# Patient Record
Sex: Female | Born: 1950 | Race: White | Hispanic: No | Marital: Married | Smoking: Former smoker
Health system: Southern US, Community
[De-identification: ages and names within clinical notes are randomized; demographics above are authoritative.]

## PROBLEM LIST (undated history)

## (undated) DIAGNOSIS — I10 Essential (primary) hypertension: Secondary | ICD-10-CM

## (undated) DIAGNOSIS — E785 Hyperlipidemia, unspecified: Secondary | ICD-10-CM

## (undated) DIAGNOSIS — R011 Cardiac murmur, unspecified: Secondary | ICD-10-CM

## (undated) DIAGNOSIS — K219 Gastro-esophageal reflux disease without esophagitis: Secondary | ICD-10-CM

## (undated) DIAGNOSIS — M1990 Unspecified osteoarthritis, unspecified site: Secondary | ICD-10-CM

## (undated) DIAGNOSIS — H269 Unspecified cataract: Secondary | ICD-10-CM

## (undated) HISTORY — PX: COSMETIC SURGERY: SHX468

## (undated) HISTORY — DX: Hyperlipidemia, unspecified: E78.5

## (undated) HISTORY — DX: Unspecified osteoarthritis, unspecified site: M19.90

## (undated) HISTORY — DX: Essential (primary) hypertension: I10

## (undated) HISTORY — DX: Gastro-esophageal reflux disease without esophagitis: K21.9

## (undated) HISTORY — DX: Cardiac murmur, unspecified: R01.1

## (undated) HISTORY — PX: TUBAL LIGATION: SHX77

## (undated) HISTORY — DX: Unspecified cataract: H26.9

## (undated) HISTORY — PX: ANTERIOR CRUCIATE LIGAMENT REPAIR: SHX115

## (undated) HISTORY — PX: HIP ARTHROPLASTY: SHX981

## (undated) HISTORY — PX: REPLACEMENT TOTAL KNEE: SUR1224

## (undated) HISTORY — PX: JOINT REPLACEMENT: SHX530

---

## 2020-12-19 ENCOUNTER — Other Ambulatory Visit: Payer: Self-pay

## 2020-12-19 ENCOUNTER — Ambulatory Visit (INDEPENDENT_AMBULATORY_CARE_PROVIDER_SITE_OTHER): Payer: Medicare Other | Admitting: Family Medicine

## 2020-12-19 ENCOUNTER — Encounter: Payer: Self-pay | Admitting: Family Medicine

## 2020-12-19 VITALS — BP 138/79 | HR 67 | Temp 97.2°F | Ht 64.0 in | Wt 143.8 lb

## 2020-12-19 DIAGNOSIS — G2581 Restless legs syndrome: Secondary | ICD-10-CM | POA: Diagnosis not present

## 2020-12-19 DIAGNOSIS — G609 Hereditary and idiopathic neuropathy, unspecified: Secondary | ICD-10-CM

## 2020-12-19 DIAGNOSIS — G8929 Other chronic pain: Secondary | ICD-10-CM | POA: Diagnosis not present

## 2020-12-19 DIAGNOSIS — Z961 Presence of intraocular lens: Secondary | ICD-10-CM | POA: Diagnosis not present

## 2020-12-19 DIAGNOSIS — Z0001 Encounter for general adult medical examination with abnormal findings: Secondary | ICD-10-CM | POA: Diagnosis not present

## 2020-12-19 DIAGNOSIS — M25552 Pain in left hip: Secondary | ICD-10-CM

## 2020-12-19 DIAGNOSIS — Z9849 Cataract extraction status, unspecified eye: Secondary | ICD-10-CM | POA: Diagnosis not present

## 2020-12-19 DIAGNOSIS — E559 Vitamin D deficiency, unspecified: Secondary | ICD-10-CM

## 2020-12-19 DIAGNOSIS — R253 Fasciculation: Secondary | ICD-10-CM

## 2020-12-19 DIAGNOSIS — Z0000 Encounter for general adult medical examination without abnormal findings: Secondary | ICD-10-CM

## 2020-12-19 NOTE — Progress Notes (Signed)
Subjective:  Patient ID: Christina Kane, female    DOB: Mar 23, 1951  Age: 70 y.o. MRN: [id number]  CC: Establish Care   HPI Christina Kane presents for new patient evaluation.  She moved here from [place] about 3 months ago and needs an ophthalmologist for follow-up on a recent cataract surgery first done in November.  The second cataract was done in January.  She has had multiple surgeries on the knees.  She is now concerned about pain in the left hip and would like to see an orthopedist.  She reports a neuropathy which is felt as numbness on the bottom of her feet.  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  She does not want to pursue this she is not interested in taking medication for the condition.  She has never had an abnormal Pap smear.  Her most recent one was in 2017 apparently.  She is not interested in gynecology referral but will see a lady provider in this office if she needs to have a female exam performed.  Depression screen [facility] 2/9 12/19/2020  Decreased Interest 0  Down, Depressed, Hopeless 0  PHQ - 2 Score 0  Altered sleeping 1  Tired, decreased energy 0  Change in appetite 1  Feeling bad or failure about yourself  0  Trouble concentrating 0  Moving slowly or fidgety/restless 0  Suicidal thoughts 0  PHQ-9 Score 2  Difficult doing work/chores Not difficult at all    History Christina Kane has a past medical history of Hyperlipidemia and Hypertension.   She has a past surgical history that includes Anterior cruciate ligament repair (Right) and Replacement total knee (Left).   Her family history includes Arthritis in her mother; Cancer in her brother; Hypertension in her mother and sister; Kidney disease in her daughter; Stroke in her mother.She reports that she quit smoking about 17 years ago. Her smoking use included cigarettes. She has never used smokeless tobacco. She reports current alcohol use of about 10.0  standard drinks of alcohol per week. She reports that she does not use drugs.    ROS Review of Systems  Constitutional: Negative.   HENT:  Negative for congestion.   Eyes:  Negative for visual disturbance (She is actually seeing okay but has had a recent cataract extraction bilaterally and needs follow-up on that.).  Respiratory:  Negative for shortness of breath.   Cardiovascular:  Negative for chest pain.  Gastrointestinal:  Negative for abdominal pain, constipation, diarrhea, nausea and vomiting.  Genitourinary:  Negative for difficulty urinating.  Musculoskeletal:  Positive for arthralgias (Knees and hips). Negative for myalgias.  Neurological:  Negative for headaches.  Psychiatric/Behavioral:  Negative for sleep disturbance.    Objective:  BP 138/79   Pulse 67   Temp (!) 97.2 F (36.2 C)   Ht 5\' 4"  (1.626 m)   Wt 143 lb 12.8 oz (65.2 kg)   SpO2 99%   BMI 24.68 kg/m   BP Readings from Last 3 Encounters:  12/19/20 138/79    Wt Readings from Last 3 Encounters:  12/19/20 143 lb 12.8 oz (65.2 kg)     Physical Exam Constitutional:      General: She is not in acute distress.    Appearance: She is well-developed.  HENT:     Head: Normocephalic and atraumatic.     Right Ear: External ear normal.     Left Ear: External ear normal.     Nose: Nose  normal.  Eyes:     Conjunctiva/sclera: Conjunctivae normal.     Pupils: Pupils are equal, round, and reactive to light.  Neck:     Thyroid: No thyromegaly.  Cardiovascular:     Rate and Rhythm: Normal rate and regular rhythm.     Heart sounds: Normal heart sounds. No murmur heard. Pulmonary:     Effort: Pulmonary effort is normal. No respiratory distress.     Breath sounds: Normal breath sounds. No wheezing or rales.  Abdominal:     General: Bowel sounds are normal. There is no distension.     Palpations: Abdomen is soft.     Tenderness: There is no abdominal tenderness.  Musculoskeletal:     Cervical back: Normal  range of motion and neck supple.  Lymphadenopathy:     Cervical: No cervical adenopathy.  Skin:    General: Skin is warm and dry.  Neurological:     Mental Status: She is alert and oriented to person, place, and time.     Deep Tendon Reflexes: Reflexes are normal and symmetric.  Psychiatric:        Behavior: Behavior normal.        Thought Content: Thought content normal.        Judgment: Judgment normal.      Assessment & Plan:   Christina Kane was seen today for establish care.  Diagnoses and all orders for this visit:  Well adult exam  Status post cataract extraction and insertion of intraocular lens, unspecified laterality  RLS (restless legs syndrome)  Benign fasciculation-cramp syndrome  Idiopathic peripheral neuropathy      I am having Christina Kane maintain her metoprolol tartrate, rosuvastatin, omeprazole, Biotin, Magnesium, Milk Thistle, and Multiple Vitamin (MULTIVITAMIN ADULT PO).  Allergies as of 12/19/2020   No Known Allergies      Medication List        Accurate as of December 19, 2020  8:11 PM. If you have any questions, ask your nurse or doctor.          Biotin 10 MG Caps Take by mouth.   Magnesium 400 MG Caps Take by mouth.   metoprolol tartrate 25 MG tablet Commonly known as: LOPRESSOR Take 25 mg by mouth daily.   Milk Thistle 250 MG Caps Take by mouth.   MULTIVITAMIN ADULT PO Take by mouth.   omeprazole 40 MG capsule Commonly known as: PRILOSEC Take 40 mg by mouth 2 (two) times a week.   rosuvastatin 10 MG tablet Commonly known as: CRESTOR Take 10 mg by mouth daily.         Follow-up: No follow-ups on file.  Claretta Fraise, M.D.

## 2020-12-21 ENCOUNTER — Other Ambulatory Visit: Payer: Self-pay

## 2020-12-21 ENCOUNTER — Other Ambulatory Visit: Payer: Medicare Other

## 2020-12-21 DIAGNOSIS — G2581 Restless legs syndrome: Secondary | ICD-10-CM | POA: Diagnosis not present

## 2020-12-21 DIAGNOSIS — E559 Vitamin D deficiency, unspecified: Secondary | ICD-10-CM | POA: Diagnosis not present

## 2020-12-21 DIAGNOSIS — Z9849 Cataract extraction status, unspecified eye: Secondary | ICD-10-CM | POA: Diagnosis not present

## 2020-12-21 DIAGNOSIS — Z961 Presence of intraocular lens: Secondary | ICD-10-CM | POA: Diagnosis not present

## 2020-12-21 DIAGNOSIS — Z0000 Encounter for general adult medical examination without abnormal findings: Secondary | ICD-10-CM | POA: Diagnosis not present

## 2020-12-21 DIAGNOSIS — R253 Fasciculation: Secondary | ICD-10-CM | POA: Diagnosis not present

## 2020-12-21 DIAGNOSIS — M25552 Pain in left hip: Secondary | ICD-10-CM | POA: Diagnosis not present

## 2020-12-21 LAB — URINALYSIS
Bilirubin, UA: NEGATIVE
Glucose, UA: NEGATIVE
Ketones, UA: NEGATIVE
Leukocytes,UA: NEGATIVE
Nitrite, UA: NEGATIVE
Protein,UA: NEGATIVE
RBC, UA: NEGATIVE
Specific Gravity, UA: 1.015 (ref 1.005–1.030)
Urobilinogen, Ur: 0.2 mg/dL (ref 0.2–1.0)
pH, UA: 7 (ref 5.0–7.5)

## 2020-12-22 LAB — CMP14+EGFR
ALT: 50 IU/L — ABNORMAL HIGH (ref 0–32)
AST: 46 IU/L — ABNORMAL HIGH (ref 0–40)
Albumin/Globulin Ratio: 1.8 (ref 1.2–2.2)
Albumin: 4.4 g/dL (ref 3.8–4.8)
Alkaline Phosphatase: 72 IU/L (ref 44–121)
BUN/Creatinine Ratio: 21 (ref 12–28)
BUN: 15 mg/dL (ref 8–27)
Bilirubin Total: 0.3 mg/dL (ref 0.0–1.2)
CO2: 24 mmol/L (ref 20–29)
Calcium: 9.2 mg/dL (ref 8.7–10.3)
Chloride: 105 mmol/L (ref 96–106)
Creatinine, Ser: 0.7 mg/dL (ref 0.57–1.00)
Globulin, Total: 2.4 g/dL (ref 1.5–4.5)
Glucose: 94 mg/dL (ref 65–99)
Potassium: 4.8 mmol/L (ref 3.5–5.2)
Sodium: 143 mmol/L (ref 134–144)
Total Protein: 6.8 g/dL (ref 6.0–8.5)
eGFR: 95 mL/min/{1.73_m2} (ref >59)

## 2020-12-22 LAB — LIPID PANEL
Chol/HDL Ratio: 3.8 ratio (ref 0.0–4.4)
Cholesterol, Total: 202 mg/dL — ABNORMAL HIGH (ref 100–199)
HDL: 53 mg/dL (ref >39)
LDL Chol Calc (NIH): 75 mg/dL (ref 0–99)
Triglycerides: 471 mg/dL — ABNORMAL HIGH (ref 0–149)
VLDL Cholesterol Cal: 74 mg/dL — ABNORMAL HIGH (ref 5–40)

## 2020-12-22 LAB — CBC WITH DIFFERENTIAL/PLATELET
Basophils Absolute: 0 10*3/uL (ref 0.0–0.2)
Basos: 0 %
EOS (ABSOLUTE): 0 10*3/uL (ref 0.0–0.4)
Eos: 0 %
Hematocrit: 41.8 % (ref 34.0–46.6)
Hemoglobin: 13.5 g/dL (ref 11.1–15.9)
Immature Grans (Abs): 0 10*3/uL (ref 0.0–0.1)
Immature Granulocytes: 0 %
Lymphocytes Absolute: 2.3 10*3/uL (ref 0.7–3.1)
Lymphs: 22 %
MCH: 30.1 pg (ref 26.6–33.0)
MCHC: 32.3 g/dL (ref 31.5–35.7)
MCV: 93 fL (ref 79–97)
Monocytes Absolute: 0.7 10*3/uL (ref 0.1–0.9)
Monocytes: 7 %
Neutrophils Absolute: 7.1 10*3/uL — ABNORMAL HIGH (ref 1.4–7.0)
Neutrophils: 71 %
Platelets: 263 10*3/uL (ref 150–450)
RBC: 4.49 x10E6/uL (ref 3.77–5.28)
RDW: 12.4 % (ref 11.7–15.4)
WBC: 10.1 10*3/uL (ref 3.4–10.8)

## 2020-12-22 LAB — VITAMIN D 25 HYDROXY (VIT D DEFICIENCY, FRACTURES): Vit D, 25-Hydroxy: 33.7 ng/mL (ref 30.0–100.0)

## 2020-12-22 LAB — SEDIMENTATION RATE: Sed Rate: 2 mm/hr (ref 0–40)

## 2020-12-22 LAB — TSH: TSH: 1.29 u[IU]/mL (ref 0.450–4.500)

## 2020-12-25 ENCOUNTER — Other Ambulatory Visit: Payer: Self-pay

## 2020-12-25 ENCOUNTER — Ambulatory Visit (INDEPENDENT_AMBULATORY_CARE_PROVIDER_SITE_OTHER): Payer: Medicare Other | Admitting: *Deleted

## 2020-12-25 DIAGNOSIS — Z23 Encounter for immunization: Secondary | ICD-10-CM

## 2020-12-25 NOTE — Progress Notes (Signed)
Pt tolerated shingles vaccine well to left deltoid. Instructions given on when to return for 2nd one

## 2021-01-12 DIAGNOSIS — M25552 Pain in left hip: Secondary | ICD-10-CM | POA: Diagnosis not present

## 2021-01-31 ENCOUNTER — Other Ambulatory Visit: Payer: Self-pay | Admitting: *Deleted

## 2021-01-31 MED ORDER — ROSUVASTATIN CALCIUM 10 MG PO TABS: 10.0000 mg | ORAL_TABLET | Freq: Every day | ORAL | 3 refills | Status: DC

## 2021-01-31 MED ORDER — METOPROLOL TARTRATE 25 MG PO TABS: 25.0000 mg | ORAL_TABLET | Freq: Every day | ORAL | 1 refills | Status: DC

## 2021-02-02 ENCOUNTER — Telehealth: Payer: Self-pay | Admitting: Family Medicine

## 2021-02-02 ENCOUNTER — Telehealth: Payer: Self-pay | Admitting: *Deleted

## 2021-02-02 NOTE — Telephone Encounter (Signed)
Christina Kane needs an Rx sent in for her fluticasone propionate 50 mcg 2 sprays in each nostril daily to [facility] for mail order delivery. Please call her if you have any questions or need further information. Thanks!

## 2021-02-02 NOTE — Telephone Encounter (Signed)
Pt called to get a refill on her Flonase to [facility] She was a new pt on 12/19/20, just moved here. Not on her med list Next Ov 06/21/21 Please advise

## 2021-02-05 ENCOUNTER — Other Ambulatory Visit: Payer: Self-pay | Admitting: Family Medicine

## 2021-02-05 MED ORDER — FLUTICASONE PROPIONATE 50 MCG/ACT NA SUSP: 2.0000 | Freq: Every day | NASAL | 3 refills | Status: DC

## 2021-02-13 DIAGNOSIS — H16223 Keratoconjunctivitis sicca, not specified as Sjogren's, bilateral: Secondary | ICD-10-CM | POA: Diagnosis not present

## 2021-02-13 DIAGNOSIS — Z961 Presence of intraocular lens: Secondary | ICD-10-CM | POA: Diagnosis not present

## 2021-02-14 DIAGNOSIS — M25552 Pain in left hip: Secondary | ICD-10-CM | POA: Diagnosis not present

## 2021-03-16 DIAGNOSIS — M25552 Pain in left hip: Secondary | ICD-10-CM | POA: Diagnosis not present

## 2021-05-30 ENCOUNTER — Ambulatory Visit
Admission: RE | Admit: 2021-05-30 | Discharge: 2021-05-30 | Disposition: A | Discharge status: Home / Self Care | Payer: Medicare Other | Source: Ambulatory Visit | Attending: Family Medicine | Admitting: Family Medicine

## 2021-05-30 ENCOUNTER — Other Ambulatory Visit: Payer: Self-pay | Admitting: Family Medicine

## 2021-05-30 DIAGNOSIS — Z139 Encounter for screening, unspecified: Secondary | ICD-10-CM

## 2021-05-30 DIAGNOSIS — Z1231 Encounter for screening mammogram for malignant neoplasm of breast: Secondary | ICD-10-CM | POA: Diagnosis not present

## 2021-06-06 DIAGNOSIS — M25552 Pain in left hip: Secondary | ICD-10-CM | POA: Diagnosis not present

## 2021-06-14 ENCOUNTER — Encounter: Payer: Self-pay | Admitting: Family Medicine

## 2021-06-14 ENCOUNTER — Ambulatory Visit (INDEPENDENT_AMBULATORY_CARE_PROVIDER_SITE_OTHER): Payer: Medicare Other | Admitting: Family Medicine

## 2021-06-14 VITALS — BP 174/81 | HR 70 | Temp 98.3°F | Ht 64.0 in | Wt 144.0 lb

## 2021-06-14 DIAGNOSIS — H6593 Unspecified nonsuppurative otitis media, bilateral: Secondary | ICD-10-CM | POA: Diagnosis not present

## 2021-06-14 DIAGNOSIS — J301 Allergic rhinitis due to pollen: Secondary | ICD-10-CM

## 2021-06-14 MED ORDER — LEVOCETIRIZINE DIHYDROCHLORIDE 5 MG PO TABS: 5.0000 mg | ORAL_TABLET | Freq: Every evening | ORAL | 3 refills | Status: DC

## 2021-06-14 NOTE — Progress Notes (Signed)
Subjective:  Patient ID: Christina Kane, female    DOB: 1951-02-15, 71 y.o.   MRN: [id number]  Patient Care Team: Claretta Fraise, MD as PCP - General (Family Medicine)   Chief Complaint:  Ear Fullness (Some pain)   HPI: Christina Kane is a 71 y.o. female presenting on 06/14/2021 for Ear Fullness (Some pain)   Pt presents with bilateral ear fullness with loss of hearing / tinnitus. She has a history or TM tube in right ear 40 years ago. She uses flonase daily. COVID negative at home.  Ear Fullness  There is pain in both (right worse than left) ears. This is a chronic problem. The problem has been gradually worsening. There has been no fever. The patient is experiencing no pain. Associated symptoms include hearing loss. Pertinent negatives include no abdominal pain, coughing, diarrhea, ear discharge, headaches, neck pain, rash, rhinorrhea, sore throat or vomiting. Treatments tried: flonase. Her past medical history is significant for a tympanostomy tube. There is no history of hearing loss.     Relevant past medical, surgical, family, and social history reviewed and updated as indicated.  Allergies and medications reviewed and updated. Data reviewed: Chart in Epic.   Past Medical History:  Diagnosis Date   Hyperlipidemia    Hypertension     Past Surgical History:  Procedure Laterality Date   ANTERIOR CRUCIATE LIGAMENT REPAIR Right    REPLACEMENT TOTAL KNEE Left     Social History   Socioeconomic History   Marital status: Married    Spouse name: Jeneen Rinks   Number of children: 2   Years of education: Not on file   Highest education level: Some college, no degree  Occupational History   Occupation: RETIRED  Tobacco Use   Smoking status: Former    Types: Cigarettes    Quit date: 2005    Years since quitting: 18.0   Smokeless tobacco: Never  Vaping Use   Vaping Use: Never used  Substance and Sexual Activity   Alcohol use: Yes    Alcohol/week: 10.0 standard drinks     Types: 10 Glasses of wine per week   Drug use: Never   Sexual activity: Yes  Other Topics Concern   Not on file  Social History Narrative   Not on file   Social Determinants of Health   Financial Resource Strain: Not on file  Food Insecurity: Not on file  Transportation Needs: Not on file  Physical Activity: Not on file  Stress: Not on file  Social Connections: Not on file  Intimate Partner Violence: Not on file    Outpatient Encounter Medications as of 06/14/2021  Medication Sig   amoxicillin (AMOXIL) 500 MG capsule Take 500 mg by mouth 3 (three) times daily.   Biotin 10 MG CAPS Take by mouth.   cycloSPORINE (RESTASIS) 0.05 % ophthalmic emulsion Restasis 0.05 % eye drops in a dropperette   fluticasone (FLONASE) 50 MCG/ACT nasal spray Place 2 sprays into both nostrils daily.   levocetirizine (XYZAL) 5 MG tablet Take 1 tablet (5 mg total) by mouth every evening.   Magnesium 400 MG CAPS Take by mouth.   metoprolol tartrate (LOPRESSOR) 25 MG tablet Take 1 tablet (25 mg total) by mouth daily.   Milk Thistle 250 MG CAPS Take by mouth.   Multiple Vitamin (MULTIVITAMIN ADULT PO) Take by mouth.   omeprazole (PRILOSEC) 40 MG capsule Take 40 mg by mouth 2 (two) times a week.   Omeprazole Magnesium (PRILOSEC PO) omeprazole   rosuvastatin (  CRESTOR) 10 MG tablet Take 1 tablet (10 mg total) by mouth daily.   triazolam (HALCION) 0.25 MG tablet triazolam 0.25 mg tablet  TAKE ONE TABLET BY MOUTH ONE HOUR PRIOR TO APPOINTMENT   No facility-administered encounter medications on file as of 06/14/2021.    No Known Allergies  Review of Systems  Constitutional:  Negative for activity change, appetite change, chills, fatigue, fever and unexpected weight change.  HENT:  Positive for congestion, hearing loss and tinnitus. Negative for dental problem, drooling, ear discharge, ear pain, facial swelling, mouth sores, nosebleeds, postnasal drip, rhinorrhea, sinus pressure, sinus pain, sneezing, sore  throat, trouble swallowing and voice change.        Ear fullness  Eyes: Negative.   Respiratory:  Negative for cough, chest tightness and shortness of breath.   Cardiovascular:  Negative for chest pain, palpitations and leg swelling.  Gastrointestinal:  Negative for abdominal pain, blood in stool, constipation, diarrhea, nausea and vomiting.  Endocrine: Negative.   Genitourinary:  Negative for decreased urine volume, dysuria, frequency and urgency.  Musculoskeletal:  Negative for arthralgias, myalgias and neck pain.  Skin: Negative.  Negative for rash.  Allergic/Immunologic: Negative.   Neurological:  Negative for dizziness, tremors, seizures, syncope, facial asymmetry, speech difficulty, weakness, light-headedness, numbness and headaches.  Hematological: Negative.   Psychiatric/Behavioral:  Negative for confusion, hallucinations, sleep disturbance and suicidal ideas.   All other systems reviewed and are negative.      Objective:  BP (!) 174/81    Pulse 70    Temp 98.3 F (36.8 C)    Ht _0  (1.626 m)    Wt 144 lb (65.3 kg)    SpO2 96%    BMI 24.72 kg/m    Wt Readings from Last 3 Encounters:  06/14/21 144 lb (65.3 kg)  12/19/20 143 lb 12.8 oz (65.2 kg)    Physical Exam Vitals and nursing note reviewed.  Constitutional:      Appearance: Normal appearance.  HENT:     Head: Normocephalic and atraumatic.     Right Ear: Decreased hearing noted. A middle ear effusion is present. Tympanic membrane is not perforated or erythematous.     Left Ear: A middle ear effusion is present. Tympanic membrane is not perforated or erythematous.     Nose: Congestion present. No rhinorrhea.     Right Turbinates: Swollen and pale.     Left Turbinates: Swollen and pale.     Mouth/Throat:     Lips: Pink.     Mouth: Mucous membranes are moist.     Pharynx: Uvula midline. No pharyngeal swelling, oropharyngeal exudate, posterior oropharyngeal erythema or uvula swelling.     Tonsils: No tonsillar  exudate or tonsillar abscesses.     Comments: Cobblestoning of posterior pharynx Eyes:     Conjunctiva/sclera: Conjunctivae normal.     Pupils: Pupils are equal, round, and reactive to light.  Cardiovascular:     Rate and Rhythm: Normal rate and regular rhythm.  Pulmonary:     Effort: Pulmonary effort is normal.     Breath sounds: Normal breath sounds.  Skin:    General: Skin is warm and dry.     Capillary Refill: Capillary refill takes less than 2 seconds.  Neurological:     General: No focal deficit present.     Mental Status: She is alert and oriented to person, place, and time.  Psychiatric:        Mood and Affect: Mood normal.  Behavior: Behavior normal.        Thought Content: Thought content normal.        Judgment: Judgment normal.    Results for orders placed or performed in visit on 12/19/20  CBC with Differential/Platelet  Result Value Ref Range   WBC 10.1 3.4 - 10.8 x10E3/uL   RBC 4.49 3.77 - 5.28 x10E6/uL   Hemoglobin 13.5 11.1 - 15.9 g/dL   Hematocrit 41.8 34.0 - 46.6 %   MCV 93 79 - 97 fL   MCH 30.1 26.6 - 33.0 pg   MCHC 32.3 31.5 - 35.7 g/dL   RDW 12.4 11.7 - 15.4 %   Platelets 263 150 - 450 x10E3/uL   Neutrophils 71 Not Estab. %   Lymphs 22 Not Estab. %   Monocytes 7 Not Estab. %   Eos 0 Not Estab. %   Basos 0 Not Estab. %   Neutrophils Absolute 7.1 (H) 1.4 - 7.0 x10E3/uL   Lymphocytes Absolute 2.3 0.7 - 3.1 x10E3/uL   Monocytes Absolute 0.7 0.1 - 0.9 x10E3/uL   EOS (ABSOLUTE) 0.0 0.0 - 0.4 x10E3/uL   Basophils Absolute 0.0 0.0 - 0.2 x10E3/uL   Immature Granulocytes 0 Not Estab. %   Immature Grans (Abs) 0.0 0.0 - 0.1 x10E3/uL  CMP14+EGFR  Result Value Ref Range   Glucose 94 65 - 99 mg/dL   BUN 15 8 - 27 mg/dL   Creatinine, Ser 0.70 0.57 - 1.00 mg/dL   eGFR 95 >59 mL/min/1.73   BUN/Creatinine Ratio 21 12 - 28   Sodium 143 134 - 144 mmol/L   Potassium 4.8 3.5 - 5.2 mmol/L   Chloride 105 96 - 106 mmol/L   CO2 24 20 - 29 mmol/L   Calcium  9.2 8.7 - 10.3 mg/dL   Total Protein 6.8 6.0 - 8.5 g/dL   Albumin 4.4 3.8 - 4.8 g/dL   Globulin, Total 2.4 1.5 - 4.5 g/dL   Albumin/Globulin Ratio 1.8 1.2 - 2.2   Bilirubin Total 0.3 0.0 - 1.2 mg/dL   Alkaline Phosphatase 72 44 - 121 IU/L   AST 46 (H) 0 - 40 IU/L   ALT 50 (H) 0 - 32 IU/L  Lipid panel  Result Value Ref Range   Cholesterol, Total 202 (H) 100 - 199 mg/dL   Triglycerides 471 (H) 0 - 149 mg/dL   HDL 53 >39 mg/dL   VLDL Cholesterol Cal 74 (H) 5 - 40 mg/dL   LDL Chol Calc (NIH) 75 0 - 99 mg/dL   Chol/HDL Ratio 3.8 0.0 - 4.4 ratio  Urinalysis  Result Value Ref Range   Specific Gravity, UA 1.015 1.005 - 1.030   pH, UA 7.0 5.0 - 7.5   Color, UA Yellow Yellow   Appearance Ur Clear Clear   Leukocytes,UA Negative Negative   Protein,UA Negative Negative/Trace   Glucose, UA Negative Negative   Ketones, UA Negative Negative   RBC, UA Negative Negative   Bilirubin, UA Negative Negative   Urobilinogen, Ur 0.2 0.2 - 1.0 mg/dL   Nitrite, UA Negative Negative  VITAMIN D 25 Hydroxy (Vit-D Deficiency, Fractures)  Result Value Ref Range   Vit D, 25-Hydroxy 33.7 30.0 - 100.0 ng/mL  TSH  Result Value Ref Range   TSH 1.290 0.450 - 4.500 uIU/mL  Sedimentation rate  Result Value Ref Range   Sed Rate 2 0 - 40 mm/hr       Pertinent labs & imaging results that were available during my care of the patient were  reviewed by me and considered in my medical decision making.  Assessment & Plan:  Christina Kane was seen today for ear fullness.  Diagnoses and all orders for this visit:  Seasonal allergic rhinitis due to pollen Cobblestoning of posterior pharynx. No erythema or drainage. Will start antihistamine as prescribed. Continue flonase. Report any new, worsening, or persistent symptoms.  -     levocetirizine (XYZAL) 5 MG tablet; Take 1 tablet (5 mg total) by mouth every evening.  Bilateral otitis media with effusion Pt aware to continue flonase daily. Will burst with steroids in office.  If symptoms persist, will refer to ENT for further evaluation.     Continue all other maintenance medications.  Follow up plan: Return if symptoms worsen or fail to improve.   Continue healthy lifestyle choices, including diet (rich in fruits, vegetables, and lean proteins, and low in salt and simple carbohydrates) and exercise (at least 30 minutes of moderate physical activity daily).  Educational handout given for allergic rhinitis  The above assessment and management plan was discussed with the patient. The patient verbalized understanding of and has agreed to the management plan. Patient is aware to call the clinic if they develop any new symptoms or if symptoms persist or worsen. Patient is aware when to return to the clinic for a follow-up visit. Patient educated on when it is appropriate to go to the emergency department.   Monia Pouch, FNP-C [facility] [phone]

## 2021-06-20 ENCOUNTER — Ambulatory Visit (INDEPENDENT_AMBULATORY_CARE_PROVIDER_SITE_OTHER): Payer: Medicare Other | Admitting: Family Medicine

## 2021-06-20 ENCOUNTER — Encounter: Payer: Self-pay | Admitting: Family Medicine

## 2021-06-20 DIAGNOSIS — U071 COVID-19: Secondary | ICD-10-CM | POA: Diagnosis not present

## 2021-06-20 MED ORDER — MOLNUPIRAVIR EUA 200MG CAPSULE: 4.0000 | ORAL_CAPSULE | Freq: Two times a day (BID) | ORAL | 0 refills | Status: AC

## 2021-06-20 MED ORDER — DEXAMETHASONE 6 MG PO TABS: 6.0000 mg | ORAL_TABLET | Freq: Two times a day (BID) | ORAL | 0 refills | Status: DC

## 2021-06-20 MED ORDER — MOLNUPIRAVIR EUA 200MG CAPSULE: 4.0000 | ORAL_CAPSULE | Freq: Two times a day (BID) | ORAL | 0 refills | Status: DC

## 2021-06-20 NOTE — Progress Notes (Signed)
Subjective:    Patient ID: Christina Kane, female    DOB: 27-Aug-1950, 71 y.o.   MRN: [id number]   HPI: Christina Kane is a 71 y.o. female presenting for Last week seen for otitis. Now worse. Took Covid test, positive yesterday. Coughing a lot. Congested. Ears feel full. Feels phlegm in throat. SCant.    Depression screen [facility] 2/9 06/14/2021 12/19/2020  Decreased Interest 0 0  Down, Depressed, Hopeless 0 0  PHQ - 2 Score 0 0  Altered sleeping 0 1  Tired, decreased energy 0 0  Change in appetite 0 1  Feeling bad or failure about yourself  0 0  Trouble concentrating 0 0  Moving slowly or fidgety/restless 0 0  Suicidal thoughts 0 0  PHQ-9 Score 0 2  Difficult doing work/chores Not difficult at all Not difficult at all     Relevant past medical, surgical, family and social history reviewed and updated as indicated.  Interim medical history since our last visit reviewed. Allergies and medications reviewed and updated.  ROS:  Review of Systems  Constitutional:  Negative for activity change, appetite change, chills and fever.  HENT:  Positive for congestion, postnasal drip, rhinorrhea and sinus pressure. Negative for ear discharge, ear pain, hearing loss, nosebleeds, sneezing and trouble swallowing.   Respiratory:  Positive for cough. Negative for chest tightness and shortness of breath.   Cardiovascular:  Negative for chest pain and palpitations.  Skin:  Negative for rash.    Social History   Tobacco Use  Smoking Status Former   Types: Cigarettes   Quit date: 2005   Years since quitting: 18.0  Smokeless Tobacco Never       Objective:     Wt Readings from Last 3 Encounters:  06/14/21 144 lb (65.3 kg)  12/19/20 143 lb 12.8 oz (65.2 kg)     Exam deferred. Pt. Harboring due to COVID 19. Phone visit performed.   Assessment & Plan:   1. COVID-19 virus infection     Meds ordered this encounter  Medications   DISCONTD: molnupiravir EUA (LAGEVRIO) 200 mg CAPS capsule     Sig: Take 4 capsules (800 mg total) by mouth 2 (two) times daily for 5 days.    Dispense:  40 capsule    Refill:  0   dexamethasone (DECADRON) 6 MG tablet    Sig: Take 1 tablet (6 mg total) by mouth 2 (two) times daily with a meal.    Dispense:  10 tablet    Refill:  0   molnupiravir EUA (LAGEVRIO) 200 mg CAPS capsule    Sig: Take 4 capsules (800 mg total) by mouth 2 (two) times daily for 5 days.    Dispense:  40 capsule    Refill:  0    No orders of the defined types were placed in this encounter.     Diagnoses and all orders for this visit:  COVID-19 virus infection  Other orders -     Discontinue: molnupiravir EUA (LAGEVRIO) 200 mg CAPS capsule; Take 4 capsules (800 mg total) by mouth 2 (two) times daily for 5 days. -     dexamethasone (DECADRON) 6 MG tablet; Take 1 tablet (6 mg total) by mouth 2 (two) times daily with a meal. -     molnupiravir EUA (LAGEVRIO) 200 mg CAPS capsule; Take 4 capsules (800 mg total) by mouth 2 (two) times daily for 5 days.    Virtual Visit via telephone Note  I discussed the limitations, risks,  security and privacy concerns of performing an evaluation and management service by telephone and the availability of in person appointments. The patient was identified with two identifiers. Pt.expressed understanding and agreed to proceed. Pt. Is at home. Dr. Livia Snellen is in his office.  Follow Up Instructions:   I discussed the assessment and treatment plan with the patient. The patient was provided an opportunity to ask questions and all were answered. The patient agreed with the plan and demonstrated an understanding of the instructions.   The patient was advised to call back or seek an in-person evaluation if the symptoms worsen or if the condition fails to improve as anticipated.   Total minutes including chart review and phone contact time: 8   Follow up plan: Return if symptoms worsen or fail to improve.  Claretta Fraise, MD [facility]

## 2021-06-21 ENCOUNTER — Ambulatory Visit: Payer: Medicare Other | Admitting: Family Medicine

## 2021-06-28 DIAGNOSIS — M1612 Unilateral primary osteoarthritis, left hip: Secondary | ICD-10-CM | POA: Diagnosis not present

## 2021-06-30 ENCOUNTER — Other Ambulatory Visit: Payer: Self-pay | Admitting: Family Medicine

## 2021-07-02 MED ORDER — METOPROLOL TARTRATE 25 MG PO TABS: 25.0000 mg | ORAL_TABLET | Freq: Every day | ORAL | 0 refills | Status: DC

## 2021-07-02 NOTE — Telephone Encounter (Signed)
Apt scheduled 07/11/2021 [facility]

## 2021-07-02 NOTE — Addendum Note (Signed)
Addended by: Antonietta Barcelona D on: 07/02/2021 12:46 PM   Modules accepted: Orders

## 2021-07-02 NOTE — Telephone Encounter (Signed)
[facility] NTBS for 6 mos FU. Mail order not sent

## 2021-07-11 ENCOUNTER — Ambulatory Visit: Payer: Medicare Other | Admitting: Family Medicine

## 2021-07-23 ENCOUNTER — Ambulatory Visit: Payer: Medicare Other | Admitting: Family Medicine

## 2021-08-23 ENCOUNTER — Encounter: Payer: Self-pay | Admitting: Family Medicine

## 2021-08-23 ENCOUNTER — Ambulatory Visit (INDEPENDENT_AMBULATORY_CARE_PROVIDER_SITE_OTHER): Payer: Medicare Other | Admitting: Family Medicine

## 2021-08-23 VITALS — BP 134/78 | HR 66 | Temp 97.6°F | Ht 64.0 in | Wt 144.2 lb

## 2021-08-23 DIAGNOSIS — M1612 Unilateral primary osteoarthritis, left hip: Secondary | ICD-10-CM | POA: Diagnosis not present

## 2021-08-23 DIAGNOSIS — G2581 Restless legs syndrome: Secondary | ICD-10-CM | POA: Diagnosis not present

## 2021-08-23 DIAGNOSIS — Z01818 Encounter for other preprocedural examination: Secondary | ICD-10-CM

## 2021-08-23 DIAGNOSIS — D485 Neoplasm of uncertain behavior of skin: Secondary | ICD-10-CM

## 2021-08-23 MED ORDER — ROPINIROLE HCL 1 MG PO TABS: 1.0000 mg | ORAL_TABLET | Freq: Every day | ORAL | 5 refills | Status: DC

## 2021-08-23 NOTE — Progress Notes (Signed)
? ?Subjective:  ?Patient ID: Christina Kane, female    DOB: 07-23-1950  Age: 71 y.o. MRN: [id number] ? ?CC: Surgical Clearance ? ? ?HPI ?Christina Kane presents for preop clearance. Having Left THA.  ? ? ?Restless legs. Takes magnesium. Not getting good sleep. Tried multiple OTC meds. ? ?Concerned about need for dental prophylaxis after surgery, before dental procedures.  ? ?Lesion behind left ear that she would like to have checked.  ? ? ?  08/23/2021  ?  9:17 AM 06/14/2021  ?  4:28 PM 12/19/2020  ? 11:32 AM  ?Depression screen PHQ 2/9  ?Decreased Interest 0 0 0  ?Down, Depressed, Hopeless 0 0 0  ?PHQ - 2 Score 0 0 0  ?Altered sleeping  0 1  ?Tired, decreased energy  0 0  ?Change in appetite  0 1  ?Feeling bad or failure about yourself   0 0  ?Trouble concentrating  0 0  ?Moving slowly or fidgety/restless  0 0  ?Suicidal thoughts  0 0  ?PHQ-9 Score  0 2  ?Difficult doing work/chores  Not difficult at all Not difficult at all  ? ? ?History ?Christina Kane has a past medical history of Hyperlipidemia and Hypertension.  ? ?She has a past surgical history that includes Anterior cruciate ligament repair (Right) and Replacement total knee (Left).  ? ?Her family history includes Arthritis in her mother; Cancer in her brother; Hypertension in her mother and sister; Kidney disease in her daughter; Stroke in her mother.She reports that she quit smoking about 18 years ago. Her smoking use included cigarettes. She has never used smokeless tobacco. She reports current alcohol use of about 10.0 standard drinks per week. She reports that she does not use drugs. ? ? ? ?ROS ?Review of Systems  ?Constitutional: Negative.   ?HENT: Negative.    ?Eyes:  Negative for visual disturbance.  ?Respiratory:  Negative for shortness of breath.   ?Cardiovascular:  Negative for chest pain.  ?Gastrointestinal:  Negative for abdominal pain.  ?Musculoskeletal:  Positive for arthralgias and joint swelling (left hip).  ?Psychiatric/Behavioral:  Positive for sleep  disturbance.   ? ?Objective:  ?BP 134/78   Pulse 66   Temp 97.6 ?F (36.4 ?C)   Ht $R'5\' 4"'wb$  (1.626 m)   Wt 144 lb 3.2 oz (65.4 kg)   SpO2 99%   BMI 24.75 kg/m?  ? ?BP Readings from Last 3 Encounters:  ?08/23/21 134/78  ?06/14/21 (!) 174/81  ?12/19/20 138/79  ? ? ?Wt Readings from Last 3 Encounters:  ?08/23/21 144 lb 3.2 oz (65.4 kg)  ?06/14/21 144 lb (65.3 kg)  ?12/19/20 143 lb 12.8 oz (65.2 kg)  ? ? ? ?Physical Exam ?Constitutional:   ?   General: She is not in acute distress. ?   Appearance: She is well-developed.  ?HENT:  ?   Head: Normocephalic and atraumatic.  ?Eyes:  ?   Conjunctiva/sclera: Conjunctivae normal.  ?   Pupils: Pupils are equal, round, and reactive to light.  ?Neck:  ?   Thyroid: No thyromegaly.  ?Cardiovascular:  ?   Rate and Rhythm: Normal rate and regular rhythm.  ?   Heart sounds: Normal heart sounds. No murmur heard. ?Pulmonary:  ?   Effort: Pulmonary effort is normal. No respiratory distress.  ?   Breath sounds: Normal breath sounds. No wheezing or rales.  ?Abdominal:  ?   General: Bowel sounds are normal. There is no distension.  ?   Palpations: Abdomen is soft.  ?   Tenderness:  There is no abdominal tenderness.  ?Musculoskeletal:     ?   General: Normal range of motion.  ?   Cervical back: Normal range of motion and neck supple.  ?Lymphadenopathy:  ?   Cervical: No cervical adenopathy.  ?Skin: ?   General: Skin is warm and dry.  ?   Findings: Lesion (4X6 mm lesion at left mastoid. Likely seb K. but somewhat resembles squamous or basal Ca. Excoriated) present.  ?Neurological:  ?   Mental Status: She is alert and oriented to person, place, and time.  ?Psychiatric:     ?   Behavior: Behavior normal.     ?   Thought Content: Thought content normal.     ?   Judgment: Judgment normal.  ? ? ? ? ?Assessment & Plan:  ? ?Christina Kane was seen today for surgical clearance. ? ?Diagnoses and all orders for this visit: ? ?Pre-op evaluation ?-     EKG 12-Lead ?-     CBC with Differential/Platelet ?-      CMP14+EGFR ?-     Magnesium ?-     Phosphorus ? ?RLS (restless legs syndrome) ?-     CBC with Differential/Platelet ?-     CMP14+EGFR ?-     Magnesium ?-     Phosphorus ? ?Neoplasm of uncertain behavior of skin ?-     Ambulatory referral to Dermatology ? ?Arthritis of left hip ? ?Other orders ?-     rOPINIRole (REQUIP) 1 MG tablet; Take 1 tablet (1 mg total) by mouth at bedtime. For leg cramps ? ? ? ? ? ? ?I have discontinued Christina Kane's amoxicillin, Omeprazole Magnesium (PRILOSEC PO), triazolam, and dexamethasone. I am also having her start on rOPINIRole. Additionally, I am having her maintain her omeprazole, Biotin, Magnesium, Milk Thistle, Multiple Vitamin (MULTIVITAMIN ADULT PO), rosuvastatin, fluticasone, cycloSPORINE, levocetirizine, and metoprolol tartrate. ? ?Allergies as of 08/23/2021   ?No Known Allergies ?  ? ?  ?Medication List  ?  ? ?  ? Accurate as of August 23, 2021 10:15 AM. If you have any questions, ask your nurse or doctor.  ?  ?  ? ?  ? ?STOP taking these medications   ? ?amoxicillin 500 MG capsule ?Commonly known as: AMOXIL ?Stopped by: Claretta Fraise, MD ?  ?dexamethasone 6 MG tablet ?Commonly known as: DECADRON ?Stopped by: Claretta Fraise, MD ?  ?PRILOSEC PO ?Stopped by: Claretta Fraise, MD ?  ?triazolam 0.25 MG tablet ?Commonly known as: HALCION ?Stopped by: Claretta Fraise, MD ?  ? ?  ? ?TAKE these medications   ? ?Biotin 10 MG Caps ?Take by mouth. ?  ?cycloSPORINE 0.05 % ophthalmic emulsion ?Commonly known as: RESTASIS ?Restasis 0.05 % eye drops in a dropperette ?  ?fluticasone 50 MCG/ACT nasal spray ?Commonly known as: FLONASE ?Place 2 sprays into both nostrils daily. ?  ?levocetirizine 5 MG tablet ?Commonly known as: XYZAL ?Take 1 tablet (5 mg total) by mouth every evening. ?  ?Magnesium 400 MG Caps ?Take by mouth. ?  ?metoprolol tartrate 25 MG tablet ?Commonly known as: LOPRESSOR ?Take 1 tablet (25 mg total) by mouth daily. ?  ?Milk Thistle 250 MG Caps ?Take by mouth. ?  ?MULTIVITAMIN ADULT  PO ?Take by mouth. ?  ?omeprazole 40 MG capsule ?Commonly known as: PRILOSEC ?Take 40 mg by mouth 2 (two) times a week. ?  ?rOPINIRole 1 MG tablet ?Commonly known as: REQUIP ?Take 1 tablet (1 mg total) by mouth at bedtime. For leg cramps ?Started by: Cletus Gash  Nelida Mandarino, MD ?  ?rosuvastatin 10 MG tablet ?Commonly known as: CRESTOR ?Take 1 tablet (10 mg total) by mouth daily. ?  ? ?  ? ?Low risk for surgery. No meds need to be held. ? ?Follow-up: Return in about 3 months (around 11/23/2021). ? ?Claretta Fraise, M.D. ?

## 2021-08-24 LAB — MAGNESIUM: Magnesium: 2.1 mg/dL (ref 1.6–2.3)

## 2021-08-24 LAB — CBC WITH DIFFERENTIAL/PLATELET
Basophils Absolute: 0 10*3/uL (ref 0.0–0.2)
Basos: 0 %
EOS (ABSOLUTE): 0.1 10*3/uL (ref 0.0–0.4)
Eos: 1 %
Hematocrit: 42.1 % (ref 34.0–46.6)
Hemoglobin: 14.1 g/dL (ref 11.1–15.9)
Immature Grans (Abs): 0 10*3/uL (ref 0.0–0.1)
Immature Granulocytes: 0 %
Lymphocytes Absolute: 2.2 10*3/uL (ref 0.7–3.1)
Lymphs: 17 %
MCH: 30.6 pg (ref 26.6–33.0)
MCHC: 33.5 g/dL (ref 31.5–35.7)
MCV: 91 fL (ref 79–97)
Monocytes Absolute: 0.8 10*3/uL (ref 0.1–0.9)
Monocytes: 6 %
Neutrophils Absolute: 9.8 10*3/uL — ABNORMAL HIGH (ref 1.4–7.0)
Neutrophils: 76 %
Platelets: 284 10*3/uL (ref 150–450)
RBC: 4.61 x10E6/uL (ref 3.77–5.28)
RDW: 13.1 % (ref 11.7–15.4)
WBC: 12.9 10*3/uL — ABNORMAL HIGH (ref 3.4–10.8)

## 2021-08-24 LAB — PHOSPHORUS: Phosphorus: 3.5 mg/dL (ref 3.0–4.3)

## 2021-08-24 LAB — CMP14+EGFR
ALT: 34 IU/L — ABNORMAL HIGH (ref 0–32)
AST: 27 IU/L (ref 0–40)
Albumin/Globulin Ratio: 1.7 (ref 1.2–2.2)
Albumin: 4.3 g/dL (ref 3.8–4.8)
Alkaline Phosphatase: 69 IU/L (ref 44–121)
BUN/Creatinine Ratio: 21 (ref 12–28)
BUN: 15 mg/dL (ref 8–27)
Bilirubin Total: 0.4 mg/dL (ref 0.0–1.2)
CO2: 24 mmol/L (ref 20–29)
Calcium: 9.5 mg/dL (ref 8.7–10.3)
Chloride: 102 mmol/L (ref 96–106)
Creatinine, Ser: 0.7 mg/dL (ref 0.57–1.00)
Globulin, Total: 2.6 g/dL (ref 1.5–4.5)
Glucose: 99 mg/dL (ref 70–99)
Potassium: 4.6 mmol/L (ref 3.5–5.2)
Sodium: 139 mmol/L (ref 134–144)
Total Protein: 6.9 g/dL (ref 6.0–8.5)
eGFR: 94 mL/min/{1.73_m2} (ref >59)

## 2021-08-27 ENCOUNTER — Telehealth: Payer: Self-pay | Admitting: Family Medicine

## 2021-08-27 NOTE — Telephone Encounter (Signed)
Pt needs nurse to send her most recent lab results to [facility] in [place] before tomorrow. ? ?Pt is scheduled for her pre surgical appt for tomorrow and needs the results.  ?

## 2021-08-27 NOTE — Telephone Encounter (Signed)
done

## 2021-08-27 NOTE — Progress Notes (Signed)
Hello [name],  Your lab result is normal and/or stable.Some minor variations that are not significant are commonly marked abnormal, but do not represent any medical problem for you.  Best regards, Ademide Schaberg, M.D.

## 2021-09-11 DIAGNOSIS — M1612 Unilateral primary osteoarthritis, left hip: Secondary | ICD-10-CM | POA: Diagnosis not present

## 2021-09-11 DIAGNOSIS — Z96642 Presence of left artificial hip joint: Secondary | ICD-10-CM | POA: Diagnosis not present

## 2021-09-16 ENCOUNTER — Other Ambulatory Visit: Payer: Self-pay | Admitting: Family Medicine

## 2021-10-18 DIAGNOSIS — Z5189 Encounter for other specified aftercare: Secondary | ICD-10-CM | POA: Diagnosis not present

## 2021-11-26 ENCOUNTER — Ambulatory Visit: Payer: Medicare Other | Admitting: Family Medicine

## 2021-12-17 ENCOUNTER — Other Ambulatory Visit: Payer: Self-pay | Admitting: *Deleted

## 2021-12-17 ENCOUNTER — Other Ambulatory Visit: Payer: Self-pay | Admitting: Family Medicine

## 2021-12-17 DIAGNOSIS — J301 Allergic rhinitis due to pollen: Secondary | ICD-10-CM

## 2021-12-17 MED ORDER — LEVOCETIRIZINE DIHYDROCHLORIDE 5 MG PO TABS: 5.0000 mg | ORAL_TABLET | Freq: Every evening | ORAL | 3 refills | Status: DC

## 2022-02-05 ENCOUNTER — Ambulatory Visit: Payer: Medicare Other | Admitting: Family Medicine

## 2022-02-12 ENCOUNTER — Ambulatory Visit: Payer: Medicare Other

## 2022-02-12 ENCOUNTER — Ambulatory Visit (INDEPENDENT_AMBULATORY_CARE_PROVIDER_SITE_OTHER): Payer: Medicare Other | Admitting: Family Medicine

## 2022-02-12 ENCOUNTER — Encounter: Payer: Self-pay | Admitting: Family Medicine

## 2022-02-12 VITALS — BP 138/98 | HR 73 | Temp 97.7°F | Ht 64.0 in | Wt 142.2 lb

## 2022-02-12 DIAGNOSIS — Z0001 Encounter for general adult medical examination with abnormal findings: Secondary | ICD-10-CM | POA: Diagnosis not present

## 2022-02-12 DIAGNOSIS — Z0000 Encounter for general adult medical examination without abnormal findings: Secondary | ICD-10-CM

## 2022-02-12 DIAGNOSIS — Z780 Asymptomatic menopausal state: Secondary | ICD-10-CM | POA: Diagnosis not present

## 2022-02-12 DIAGNOSIS — Z136 Encounter for screening for cardiovascular disorders: Secondary | ICD-10-CM | POA: Diagnosis not present

## 2022-02-12 DIAGNOSIS — J324 Chronic pansinusitis: Secondary | ICD-10-CM | POA: Diagnosis not present

## 2022-02-12 LAB — URINALYSIS
Bilirubin, UA: NEGATIVE
Glucose, UA: NEGATIVE
Leukocytes,UA: NEGATIVE
Nitrite, UA: NEGATIVE
Protein,UA: NEGATIVE
RBC, UA: NEGATIVE
Specific Gravity, UA: 1.015 (ref 1.005–1.030)
Urobilinogen, Ur: 0.2 mg/dL (ref 0.2–1.0)
pH, UA: 6.5 (ref 5.0–7.5)

## 2022-02-12 MED ORDER — ROSUVASTATIN CALCIUM 10 MG PO TABS: 10.0000 mg | ORAL_TABLET | Freq: Every day | ORAL | 3 refills | Status: DC

## 2022-02-12 NOTE — Progress Notes (Signed)
Subjective:  Patient ID: Christina Kane, female    DOB: 1951/01/23  Age: 71 y.o. MRN: [id number]  CC: Annual Exam   HPI Christina Kane presents for Annual physical     02/12/2022    2:57 PM 08/23/2021    9:17 AM 06/14/2021    4:28 PM  Depression screen PHQ 2/9  Decreased Interest 0 0 0  Down, Depressed, Hopeless 0 0 0  PHQ - 2 Score 0 0 0  Altered sleeping   0  Tired, decreased energy   0  Change in appetite   0  Feeling bad or failure about yourself    0  Trouble concentrating   0  Moving slowly or fidgety/restless   0  Suicidal thoughts   0  PHQ-9 Score   0  Difficult doing work/chores   Not difficult at all    History Christina Kane has a past medical history of Hyperlipidemia and Hypertension.   She has a past surgical history that includes Anterior cruciate ligament repair (Right); Replacement total knee (Left); and Hip Arthroplasty (Left).   Her family history includes Arthritis in her mother; Cancer in her brother; Hypertension in her mother and sister; Kidney disease in her daughter; Stroke in her mother.She reports that she quit smoking about 18 years ago. Her smoking use included cigarettes. She has never used smokeless tobacco. She reports current alcohol use of about 10.0 standard drinks of alcohol per week. She reports that she does not use drugs.    ROS Review of Systems  Constitutional:  Negative for activity change, appetite change, chills, diaphoresis, fatigue, fever and unexpected weight change.  HENT:  Positive for congestion, facial swelling, hearing loss, sinus pressure and tinnitus (for 20 years). Negative for ear discharge, ear pain, nosebleeds, postnasal drip, rhinorrhea, sneezing, sore throat and trouble swallowing.   Eyes:  Positive for visual disturbance (cataract removal. Has implants). Negative for pain.  Respiratory:  Negative for cough, chest tightness and shortness of breath.   Cardiovascular:  Negative for chest pain and palpitations.   Gastrointestinal:  Negative for abdominal pain, constipation, diarrhea, nausea and vomiting.  Endocrine: Negative for cold intolerance, heat intolerance, polydipsia, polyphagia and polyuria.  Genitourinary:  Negative for dysuria, frequency and menstrual problem.  Musculoskeletal:  Positive for arthralgias (s/p hip replacement. Doing well. Waiting to do right knee). Negative for joint swelling.  Skin:  Negative for rash.  Allergic/Immunologic: Negative for environmental allergies.  Neurological:  Negative for dizziness, weakness, numbness and headaches.  Psychiatric/Behavioral:  Negative for agitation and dysphoric mood.     Objective:  BP (!) 138/98   Pulse 73   Temp 97.7 F (36.5 C)   Ht $R'5\' 4"'aX$  (1.626 m)   Wt 142 lb 3.2 oz (64.5 kg)   SpO2 98%   BMI 24.41 kg/m   BP Readings from Last 3 Encounters:  02/12/22 (!) 138/98  08/23/21 134/78  06/14/21 (!) 174/81    Wt Readings from Last 3 Encounters:  02/12/22 142 lb 3.2 oz (64.5 kg)  08/23/21 144 lb 3.2 oz (65.4 kg)  06/14/21 144 lb (65.3 kg)     Physical Exam Exam conducted with a chaperone present.  Constitutional:      General: She is not in acute distress.    Appearance: She is well-developed.  HENT:     Head: Normocephalic and atraumatic.  Eyes:     Conjunctiva/sclera: Conjunctivae normal.     Pupils: Pupils are equal, round, and reactive to light.  Neck:  Thyroid: No thyromegaly.  Cardiovascular:     Rate and Rhythm: Normal rate and regular rhythm.     Heart sounds: Normal heart sounds. No murmur heard. Pulmonary:     Effort: Pulmonary effort is normal. No respiratory distress.     Breath sounds: Normal breath sounds. No wheezing or rales.  Chest:  Breasts:    Tanner Score is 5.     Breasts are symmetrical.     Right: Normal.     Left: Normal.  Abdominal:     General: Bowel sounds are normal. There is no distension.     Palpations: Abdomen is soft.     Tenderness: There is no abdominal tenderness.   Musculoskeletal:        General: Normal range of motion.     Cervical back: Normal range of motion and neck supple.  Lymphadenopathy:     Cervical: No cervical adenopathy.  Skin:    General: Skin is warm and dry.  Neurological:     Mental Status: She is alert and oriented to person, place, and time.  Psychiatric:        Behavior: Behavior normal.        Thought Content: Thought content normal.        Judgment: Judgment normal.      Assessment & Plan:   Christina Kane was seen today for annual exam.  Diagnoses and all orders for this visit:  Well adult exam -     CBC with Differential/Platelet -     CMP14+EGFR -     Lipid panel -     VITAMIN D 25 Hydroxy (Vit-D Deficiency, Fractures) -     Urinalysis  Post-menopause -     DG Bone Density; Future  Chronic pansinusitis -     Ambulatory referral to ENT  Other orders -     rosuvastatin (CRESTOR) 10 MG tablet; Take 1 tablet (10 mg total) by mouth daily.       I am having Christina Kane maintain her omeprazole, Biotin, Magnesium, Milk Thistle, Multiple Vitamin (MULTIVITAMIN ADULT PO), fluticasone, cycloSPORINE, rOPINIRole, metoprolol tartrate, levocetirizine, and rosuvastatin.  Allergies as of 02/12/2022   No Known Allergies      Medication List        Accurate as of February 12, 2022  5:03 PM. If you have any questions, ask your nurse or doctor.          Biotin 10 MG Caps Take by mouth.   cycloSPORINE 0.05 % ophthalmic emulsion Commonly known as: RESTASIS Restasis 0.05 % eye drops in a dropperette   fluticasone 50 MCG/ACT nasal spray Commonly known as: FLONASE Place 2 sprays into both nostrils daily.   levocetirizine 5 MG tablet Commonly known as: XYZAL Take 1 tablet (5 mg total) by mouth every evening.   Magnesium 400 MG Caps Take by mouth.   metoprolol tartrate 25 MG tablet Commonly known as: LOPRESSOR TAKE 1 TABLET BY MOUTH DAILY   Milk Thistle 250 MG Caps Take by mouth.   MULTIVITAMIN ADULT  PO Take by mouth.   omeprazole 40 MG capsule Commonly known as: PRILOSEC Take 40 mg by mouth 2 (two) times a week.   rOPINIRole 1 MG tablet Commonly known as: REQUIP Take 1 tablet (1 mg total) by mouth at bedtime. For leg cramps   rosuvastatin 10 MG tablet Commonly known as: CRESTOR Take 1 tablet (10 mg total) by mouth daily.         Follow-up: Return in about 1 year (  around 02/13/2023).  Claretta Fraise, M.D.

## 2022-02-13 LAB — CBC WITH DIFFERENTIAL/PLATELET
Basophils Absolute: 0 10*3/uL (ref 0.0–0.2)
Basos: 0 %
EOS (ABSOLUTE): 0 10*3/uL (ref 0.0–0.4)
Eos: 0 %
Hematocrit: 41.4 % (ref 34.0–46.6)
Hemoglobin: 13.3 g/dL (ref 11.1–15.9)
Immature Grans (Abs): 0 10*3/uL (ref 0.0–0.1)
Immature Granulocytes: 0 %
Lymphocytes Absolute: 2.4 10*3/uL (ref 0.7–3.1)
Lymphs: 20 %
MCH: 30 pg (ref 26.6–33.0)
MCHC: 32.1 g/dL (ref 31.5–35.7)
MCV: 94 fL (ref 79–97)
Monocytes Absolute: 0.8 10*3/uL (ref 0.1–0.9)
Monocytes: 7 %
Neutrophils Absolute: 9.1 10*3/uL — ABNORMAL HIGH (ref 1.4–7.0)
Neutrophils: 73 %
Platelets: 249 10*3/uL (ref 150–450)
RBC: 4.43 x10E6/uL (ref 3.77–5.28)
RDW: 12.9 % (ref 11.7–15.4)
WBC: 12.4 10*3/uL — ABNORMAL HIGH (ref 3.4–10.8)

## 2022-02-13 LAB — VITAMIN D 25 HYDROXY (VIT D DEFICIENCY, FRACTURES): Vit D, 25-Hydroxy: 43.4 ng/mL (ref 30.0–100.0)

## 2022-02-13 LAB — CMP14+EGFR
ALT: 26 IU/L (ref 0–32)
AST: 25 IU/L (ref 0–40)
Albumin/Globulin Ratio: 1.7 (ref 1.2–2.2)
Albumin: 4.4 g/dL (ref 3.9–4.9)
Alkaline Phosphatase: 73 IU/L (ref 44–121)
BUN/Creatinine Ratio: 19 (ref 12–28)
BUN: 15 mg/dL (ref 8–27)
Bilirubin Total: 0.4 mg/dL (ref 0.0–1.2)
CO2: 24 mmol/L (ref 20–29)
Calcium: 9.3 mg/dL (ref 8.7–10.3)
Chloride: 101 mmol/L (ref 96–106)
Creatinine, Ser: 0.78 mg/dL (ref 0.57–1.00)
Globulin, Total: 2.6 g/dL (ref 1.5–4.5)
Glucose: 85 mg/dL (ref 70–99)
Potassium: 4.2 mmol/L (ref 3.5–5.2)
Sodium: 140 mmol/L (ref 134–144)
Total Protein: 7 g/dL (ref 6.0–8.5)
eGFR: 83 mL/min/{1.73_m2} (ref >59)

## 2022-02-13 LAB — LIPID PANEL
Chol/HDL Ratio: 2.1 ratio (ref 0.0–4.4)
Cholesterol, Total: 193 mg/dL (ref 100–199)
HDL: 93 mg/dL (ref >39)
LDL Chol Calc (NIH): 57 mg/dL (ref 0–99)
Triglycerides: 280 mg/dL — ABNORMAL HIGH (ref 0–149)
VLDL Cholesterol Cal: 43 mg/dL — ABNORMAL HIGH (ref 5–40)

## 2022-02-13 NOTE — Progress Notes (Signed)
Hello Arabel,  Your lab result is normal and/or stable.Some minor variations that are not significant are commonly marked abnormal, but do not represent any medical problem for you.  Best regards, Claretta Fraise, M.D.

## 2022-02-18 ENCOUNTER — Ambulatory Visit (INDEPENDENT_AMBULATORY_CARE_PROVIDER_SITE_OTHER): Payer: Medicare Other

## 2022-02-18 ENCOUNTER — Other Ambulatory Visit: Payer: Self-pay

## 2022-02-18 DIAGNOSIS — H16223 Keratoconjunctivitis sicca, not specified as Sjogren's, bilateral: Secondary | ICD-10-CM | POA: Diagnosis not present

## 2022-02-18 DIAGNOSIS — Z780 Asymptomatic menopausal state: Secondary | ICD-10-CM

## 2022-02-18 DIAGNOSIS — M85851 Other specified disorders of bone density and structure, right thigh: Secondary | ICD-10-CM | POA: Diagnosis not present

## 2022-02-18 NOTE — Progress Notes (Signed)
DXA was done on 02/12/2022 but due to system error did not cross over for radiologist to read and prepare report. The exam had to be canceled and reorder to correct the issue - images obtained on 02/12/2022. Scan was not repeated.

## 2022-02-19 ENCOUNTER — Telehealth: Payer: Self-pay | Admitting: Family Medicine

## 2022-02-24 ENCOUNTER — Other Ambulatory Visit: Payer: Self-pay | Admitting: Family Medicine

## 2022-03-26 DIAGNOSIS — J343 Hypertrophy of nasal turbinates: Secondary | ICD-10-CM | POA: Diagnosis not present

## 2022-03-26 DIAGNOSIS — H6983 Other specified disorders of Eustachian tube, bilateral: Secondary | ICD-10-CM | POA: Diagnosis not present

## 2022-03-26 DIAGNOSIS — R0982 Postnasal drip: Secondary | ICD-10-CM | POA: Diagnosis not present

## 2022-03-26 DIAGNOSIS — J310 Chronic rhinitis: Secondary | ICD-10-CM | POA: Diagnosis not present

## 2022-03-26 DIAGNOSIS — H903 Sensorineural hearing loss, bilateral: Secondary | ICD-10-CM | POA: Diagnosis not present

## 2022-04-10 ENCOUNTER — Other Ambulatory Visit: Payer: Self-pay | Admitting: Family Medicine

## 2022-07-01 ENCOUNTER — Other Ambulatory Visit: Payer: Self-pay | Admitting: Family Medicine

## 2022-07-01 DIAGNOSIS — Z1231 Encounter for screening mammogram for malignant neoplasm of breast: Secondary | ICD-10-CM

## 2022-07-08 ENCOUNTER — Ambulatory Visit
Admission: RE | Admit: 2022-07-08 | Discharge: 2022-07-08 | Disposition: A | Discharge status: Home / Self Care | Payer: Medicare Other | Source: Ambulatory Visit | Attending: Family Medicine | Admitting: Family Medicine

## 2022-07-08 DIAGNOSIS — Z1231 Encounter for screening mammogram for malignant neoplasm of breast: Secondary | ICD-10-CM

## 2022-10-04 IMAGING — MG MM DIGITAL SCREENING BILAT W/ TOMO AND CAD
8 series · 8 of 24 positions shown · non-contrast
Comparison: Previous exam(s).

ACR Breast Density Category a: The breast tissue is almost entirely
fatty.

CLINICAL DATA: Screening.

EXAM:
DIGITAL SCREENING BILATERAL MAMMOGRAM WITH TOMOSYNTHESIS AND CAD
TECHNIQUE: Bilateral screening digital craniocaudal and mediolateral oblique
mammograms were obtained. Bilateral screening digital breast
tomosynthesis was performed. The images were evaluated with
computer-aided detection.

[R MLO synth-2D]
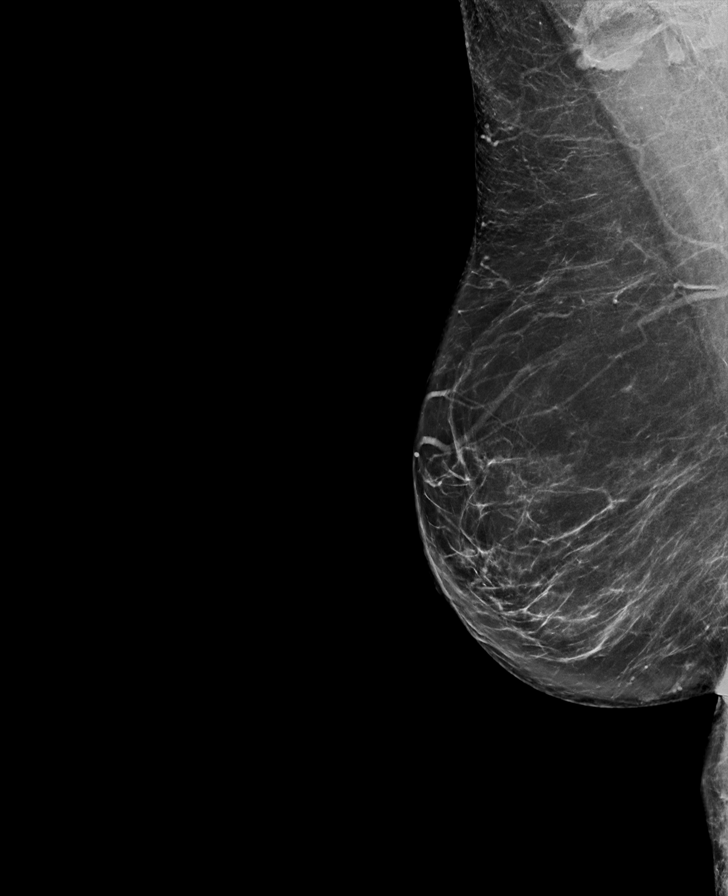

[R CC synth-2D]
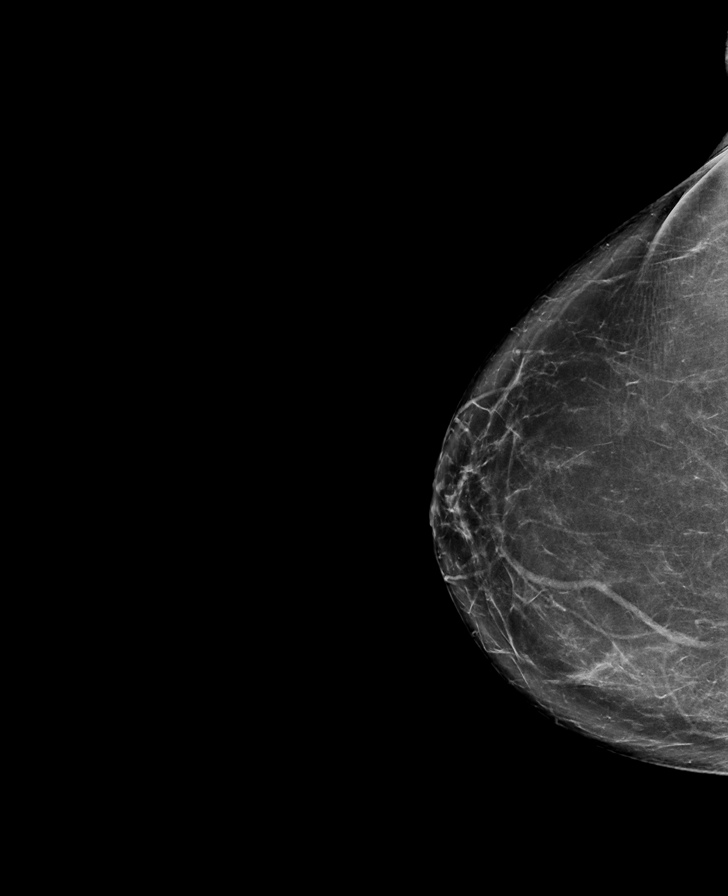

[L CC synth-2D]
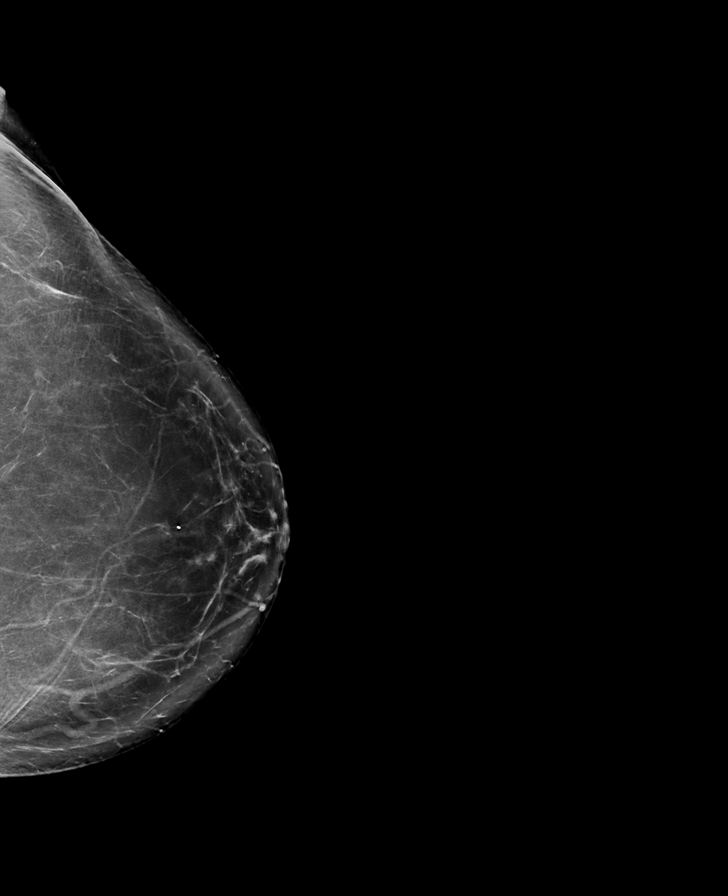

[L MLO synth-2D]
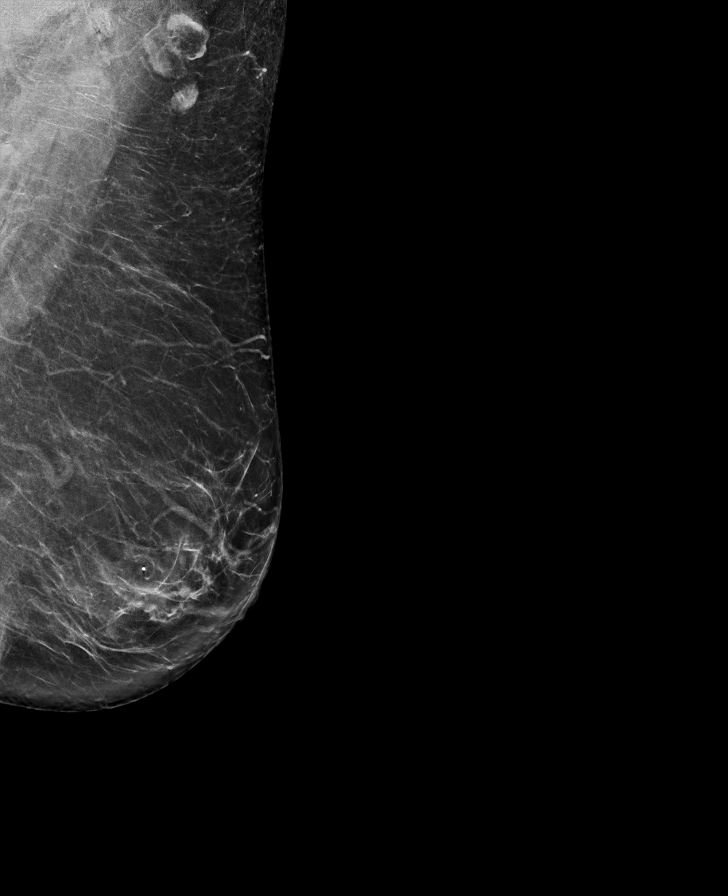

[R CC tomo · tomo slice 41/81.0]
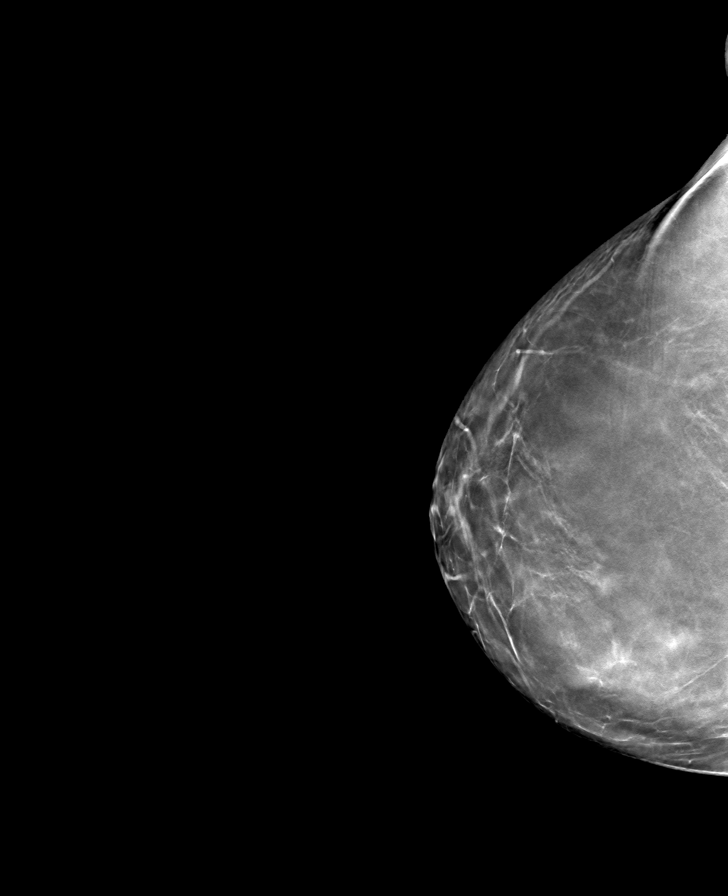

[L CC tomo · tomo slice 43/86.0]
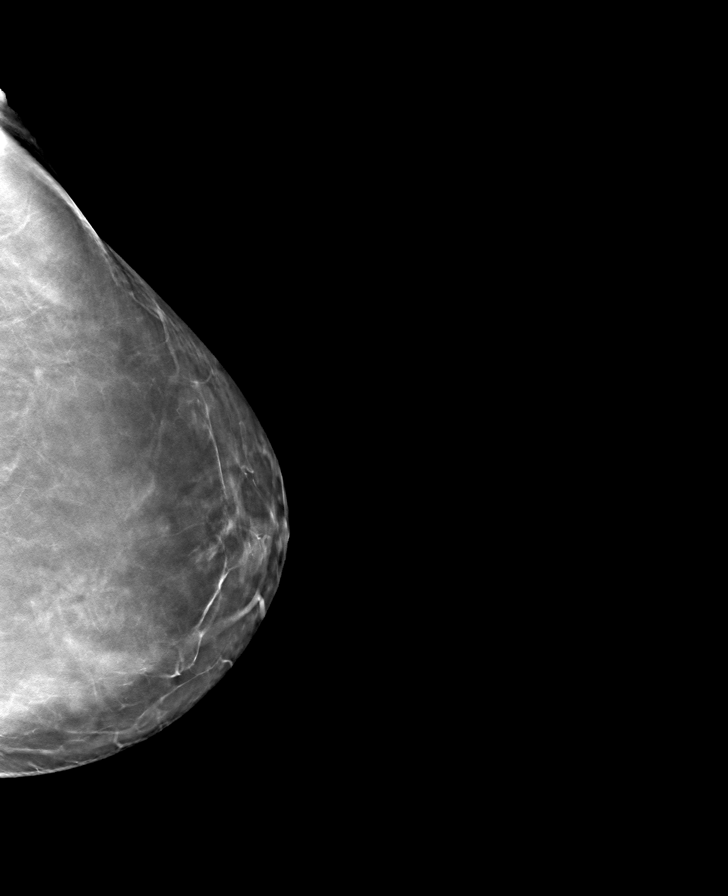

[L MLO tomo · tomo slice 42/83.0]
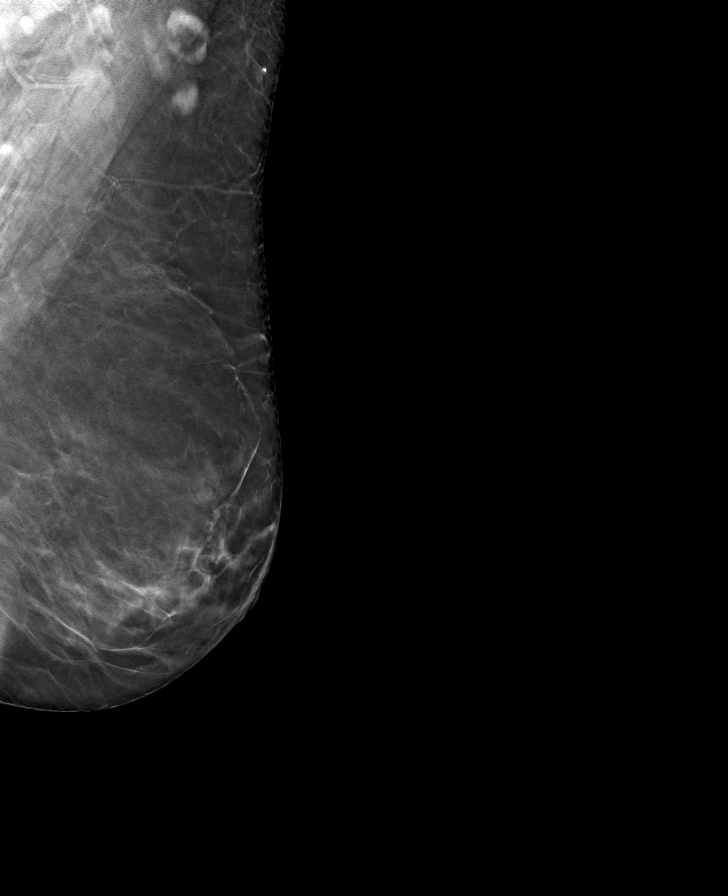

[R MLO tomo · tomo slice 43/85.0]
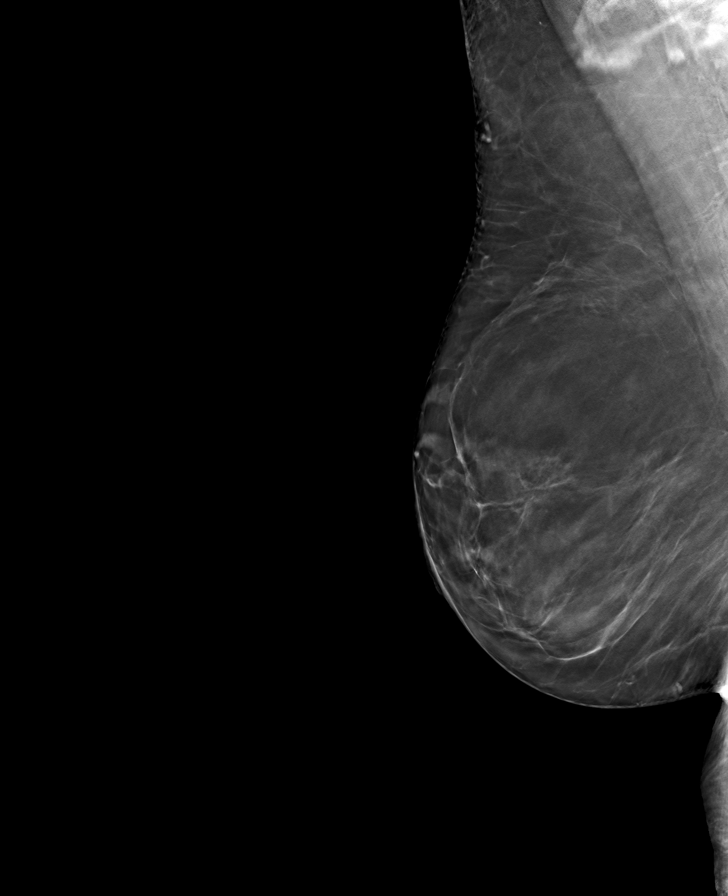

[8 of 24 positions shown; findings below may reference images not displayed]

FINDINGS: There are no findings suspicious for malignancy.
IMPRESSION: No mammographic evidence of malignancy. A result letter of this
screening mammogram will be mailed directly to the patient.

RECOMMENDATION:
Screening mammogram in one year. (Code:0E-3-N98)

BI-RADS CATEGORY  1: Negative.

## 2022-10-19 ENCOUNTER — Other Ambulatory Visit: Payer: Self-pay | Admitting: Family Medicine

## 2022-12-10 ENCOUNTER — Other Ambulatory Visit: Payer: Self-pay | Admitting: Family Medicine

## 2022-12-11 NOTE — Telephone Encounter (Signed)
Needs appt  Before further refills  

## 2022-12-11 NOTE — Telephone Encounter (Signed)
Authorize 30 days only. Then contact the patient letting them know that they will need an appointment before any further prescriptions can be sent in. 

## 2022-12-12 NOTE — Telephone Encounter (Signed)
Pt made appt for Aug 7

## 2022-12-31 ENCOUNTER — Other Ambulatory Visit: Payer: Self-pay | Admitting: Family Medicine

## 2023-01-08 ENCOUNTER — Ambulatory Visit: Payer: Medicare Other | Admitting: Family Medicine

## 2023-01-08 ENCOUNTER — Encounter: Payer: Self-pay | Admitting: Family Medicine

## 2023-01-08 VITALS — BP 129/64 | HR 67 | Temp 97.2°F | Ht 64.0 in | Wt 147.8 lb

## 2023-01-08 DIAGNOSIS — E782 Mixed hyperlipidemia: Secondary | ICD-10-CM | POA: Diagnosis not present

## 2023-01-08 DIAGNOSIS — J301 Allergic rhinitis due to pollen: Secondary | ICD-10-CM | POA: Diagnosis not present

## 2023-01-08 LAB — CBC WITH DIFFERENTIAL/PLATELET
Basophils Absolute: 0 10*3/uL (ref 0.0–0.2)
Basos: 0 %
EOS (ABSOLUTE): 0.1 10*3/uL (ref 0.0–0.4)
Eos: 1 %
Hematocrit: 38.8 % (ref 34.0–46.6)
Hemoglobin: 12.9 g/dL (ref 11.1–15.9)
Immature Grans (Abs): 0 10*3/uL (ref 0.0–0.1)
Immature Granulocytes: 0 %
Lymphocytes Absolute: 2.3 10*3/uL (ref 0.7–3.1)
Lymphs: 27 %
MCH: 30.1 pg (ref 26.6–33.0)
MCHC: 33.2 g/dL (ref 31.5–35.7)
MCV: 90 fL (ref 79–97)
Monocytes Absolute: 0.8 10*3/uL (ref 0.1–0.9)
Monocytes: 9 %
Neutrophils Absolute: 5.2 10*3/uL (ref 1.4–7.0)
Neutrophils: 63 %
Platelets: 267 10*3/uL (ref 150–450)
RBC: 4.29 x10E6/uL (ref 3.77–5.28)
RDW: 12.7 % (ref 11.7–15.4)
WBC: 8.3 10*3/uL (ref 3.4–10.8)

## 2023-01-08 LAB — CMP14+EGFR
ALT: 35 IU/L — ABNORMAL HIGH (ref 0–32)
AST: 29 IU/L (ref 0–40)
Albumin: 4.2 g/dL (ref 3.9–4.9)
Alkaline Phosphatase: 62 IU/L (ref 44–121)
BUN/Creatinine Ratio: 21 (ref 12–28)
BUN: 16 mg/dL (ref 8–27)
Bilirubin Total: 0.4 mg/dL (ref 0.0–1.2)
CO2: 23 mmol/L (ref 20–29)
Calcium: 9 mg/dL (ref 8.7–10.3)
Chloride: 103 mmol/L (ref 96–106)
Creatinine, Ser: 0.76 mg/dL (ref 0.57–1.00)
Globulin, Total: 2.5 g/dL (ref 1.5–4.5)
Glucose: 88 mg/dL (ref 70–99)
Potassium: 5 mmol/L (ref 3.5–5.2)
Sodium: 140 mmol/L (ref 134–144)
Total Protein: 6.7 g/dL (ref 6.0–8.5)
eGFR: 85 mL/min/{1.73_m2} (ref >59)

## 2023-01-08 LAB — LIPID PANEL
Chol/HDL Ratio: 2.1 ratio (ref 0.0–4.4)
Cholesterol, Total: 163 mg/dL (ref 100–199)
HDL: 79 mg/dL (ref >39)
LDL Chol Calc (NIH): 63 mg/dL (ref 0–99)
Triglycerides: 125 mg/dL (ref 0–149)
VLDL Cholesterol Cal: 21 mg/dL (ref 5–40)

## 2023-01-08 MED ORDER — LEVOCETIRIZINE DIHYDROCHLORIDE 5 MG PO TABS: 5.0000 mg | ORAL_TABLET | Freq: Every evening | ORAL | 2 refills | Status: AC

## 2023-01-08 MED ORDER — ROSUVASTATIN CALCIUM 10 MG PO TABS
10.0000 mg | ORAL_TABLET | Freq: Every day | ORAL | 2 refills | Status: DC
Start: 2023-01-08 — End: 2023-02-17

## 2023-01-08 MED ORDER — METOPROLOL TARTRATE 25 MG PO TABS: 25.0000 mg | ORAL_TABLET | Freq: Every day | ORAL | 2 refills | Status: DC

## 2023-01-08 MED ORDER — METHOCARBAMOL 500 MG PO TABS: 500.0000 mg | ORAL_TABLET | Freq: Every day | ORAL | 1 refills | Status: AC

## 2023-01-08 NOTE — Progress Notes (Signed)
Subjective:  Patient ID: Christina Kane, female    DOB: 22-Mar-1951  Age: 72 y.o. MRN: [id number]  CC: Medical Management of Chronic Issues   HPI Christina Kane presents for  in for follow-up of elevated cholesterol. Doing well without complaints on current medication. Denies side effects of statin including myalgia and arthralgia and nausea. Currently no chest pain, shortness of breath or other cardiovascular related symptoms noted.  presents for  follow-up of hypertension. Patient has no history of headache chest pain or shortness of breath or recent cough. Patient also denies symptoms of TIA such as focal numbness or weakness. Patient denies side effects from medication. States taking it regularly.   HX of internal tremor & benign fasciculations.awakening Christina Kane at night. Gets dizzy at night. BP going up 156/90. Hasn't missed Christina Kane BP med. Robaxin helped in the past.      01/08/2023   10:03 AM 01/08/2023    9:57 AM 02/12/2022    2:57 PM  Depression screen PHQ 2/9  Decreased Interest 0 0 0  Down, Depressed, Hopeless 0 0 0  PHQ - 2 Score 0 0 0  Altered sleeping 2    Tired, decreased energy 1    Change in appetite 0    Feeling bad or failure about yourself  0    Trouble concentrating 0    Moving slowly or fidgety/restless 0    Suicidal thoughts 0    PHQ-9 Score 3    Difficult doing work/chores Not difficult at all      History Christina Kane has a past medical history of Hyperlipidemia and Hypertension.   Christina Kane has a past surgical history that includes Anterior cruciate ligament repair (Right); Replacement total knee (Left); and Hip Arthroplasty (Left).   Christina Kane family history includes Arthritis in Christina Kane mother; Cancer in Christina Kane brother; Hypertension in Christina Kane mother and sister; Kidney disease in Christina Kane daughter; Stroke in Christina Kane mother.Christina Kane reports that Christina Kane quit smoking about 19 years ago. Christina Kane smoking use included cigarettes. Christina Kane has never used smokeless tobacco. Christina Kane reports current alcohol use of about 10.0 standard  drinks of alcohol per week. Christina Kane reports that Christina Kane does not use drugs.    ROS Review of Systems  Constitutional: Negative.   HENT: Negative.    Eyes:  Negative for visual disturbance.  Respiratory:  Negative for shortness of breath.   Cardiovascular:  Negative for chest pain.  Gastrointestinal:  Negative for abdominal pain.  Musculoskeletal:  Negative for arthralgias.    Objective:  BP 129/64   Pulse 67   Temp (!) 97.2 F (36.2 C)   Ht 5\' 4"  (1.626 m)   Wt 147 lb 12.8 oz (67 kg)   SpO2 98%   BMI 25.37 kg/m   BP Readings from Last 3 Encounters:  01/08/23 129/64  02/12/22 (!) 138/98  08/23/21 134/78    Wt Readings from Last 3 Encounters:  01/08/23 147 lb 12.8 oz (67 kg)  02/12/22 142 lb 3.2 oz (64.5 kg)  08/23/21 144 lb 3.2 oz (65.4 kg)     Physical Exam Constitutional:      General: Christina Kane is not in acute distress.    Appearance: Christina Kane is well-developed.  Cardiovascular:     Rate and Rhythm: Normal rate and regular rhythm.  Pulmonary:     Breath sounds: Normal breath sounds.  Musculoskeletal:        General: Normal range of motion.  Skin:    General: Skin is warm and dry.  Neurological:     Mental Status:  Christina Kane is alert and oriented to person, place, and time.       Assessment & Plan:   Christina Kane was seen today for medical management of chronic issues.  Diagnoses and all orders for this visit:  Mixed hyperlipidemia -     CBC with Differential/Platelet -     CMP14+EGFR -     Lipid panel  Seasonal allergic rhinitis due to pollen -     levocetirizine (XYZAL) 5 MG tablet; Take 1 tablet (5 mg total) by mouth every evening. -     CBC with Differential/Platelet -     CMP14+EGFR  Other orders -     metoprolol tartrate (LOPRESSOR) 25 MG tablet; Take 1 tablet (25 mg total) by mouth daily. -     rosuvastatin (CRESTOR) 10 MG tablet; Take 1 tablet (10 mg total) by mouth daily. -     methocarbamol (ROBAXIN) 500 MG tablet; Take 1-2 tablets (500-1,000 mg total) by mouth  at bedtime.       I have discontinued Sada Snook's cycloSPORINE and rOPINIRole. I have also changed Christina Kane metoprolol tartrate and rosuvastatin. Additionally, I am having Christina Kane start on methocarbamol. Lastly, I am having Christina Kane maintain Christina Kane omeprazole, Biotin, Magnesium, Milk Thistle, Multiple Vitamin (MULTIVITAMIN ADULT PO), fluticasone, and levocetirizine.  Allergies as of 01/08/2023   No Known Allergies      Medication List        Accurate as of January 08, 2023 10:25 AM. If you have any questions, ask your nurse or doctor.          STOP taking these medications    cycloSPORINE 0.05 % ophthalmic emulsion Commonly known as: RESTASIS Stopped by: Estus Krakowski   rOPINIRole 1 MG tablet Commonly known as: REQUIP Stopped by: Sang Blount       TAKE these medications    Biotin 10 MG Caps Take by mouth.   fluticasone 50 MCG/ACT nasal spray Commonly known as: FLONASE USE 2 SPRAYS IN BOTH  NOSTRILS DAILY   levocetirizine 5 MG tablet Commonly known as: XYZAL Take 1 tablet (5 mg total) by mouth every evening.   Magnesium 400 MG Caps Take by mouth.   methocarbamol 500 MG tablet Commonly known as: ROBAXIN Take 1-2 tablets (500-1,000 mg total) by mouth at bedtime. Started by: Dawid Dupriest   metoprolol tartrate 25 MG tablet Commonly known as: LOPRESSOR Take 1 tablet (25 mg total) by mouth daily.   Milk Thistle 250 MG Caps Take by mouth.   MULTIVITAMIN ADULT PO Take by mouth.   omeprazole 40 MG capsule Commonly known as: PRILOSEC Take 40 mg by mouth 2 (two) times a week.   rosuvastatin 10 MG tablet Commonly known as: CRESTOR Take 1 tablet (10 mg total) by mouth daily.         Follow-up: Return in about 6 months (around 07/11/2023), or if symptoms worsen or fail to improve.  Mechele Claude, M.D.

## 2023-01-09 NOTE — Progress Notes (Signed)
Hello Christina Kane,  Your lab result is normal and/or stable.Some minor variations that are not significant are commonly marked abnormal, but do not represent any medical problem for you.  Best regards, Meilin Brosh, M.D.

## 2023-02-17 ENCOUNTER — Other Ambulatory Visit: Payer: Self-pay | Admitting: Family Medicine

## 2023-03-06 ENCOUNTER — Encounter: Payer: Self-pay | Admitting: Family Medicine

## 2023-03-26 ENCOUNTER — Encounter (INDEPENDENT_AMBULATORY_CARE_PROVIDER_SITE_OTHER): Payer: Self-pay

## 2023-03-27 ENCOUNTER — Ambulatory Visit (INDEPENDENT_AMBULATORY_CARE_PROVIDER_SITE_OTHER): Payer: Medicare Other | Admitting: Otolaryngology

## 2023-03-27 ENCOUNTER — Encounter (INDEPENDENT_AMBULATORY_CARE_PROVIDER_SITE_OTHER): Payer: Self-pay

## 2023-03-27 VITALS — Ht 64.0 in | Wt 145.0 lb

## 2023-03-27 DIAGNOSIS — H903 Sensorineural hearing loss, bilateral: Secondary | ICD-10-CM | POA: Diagnosis not present

## 2023-03-27 DIAGNOSIS — H6983 Other specified disorders of Eustachian tube, bilateral: Secondary | ICD-10-CM | POA: Diagnosis not present

## 2023-03-27 DIAGNOSIS — R0982 Postnasal drip: Secondary | ICD-10-CM

## 2023-03-27 DIAGNOSIS — J310 Chronic rhinitis: Secondary | ICD-10-CM | POA: Diagnosis not present

## 2023-03-30 DIAGNOSIS — J310 Chronic rhinitis: Secondary | ICD-10-CM | POA: Insufficient documentation

## 2023-03-30 DIAGNOSIS — H6983 Other specified disorders of Eustachian tube, bilateral: Secondary | ICD-10-CM | POA: Insufficient documentation

## 2023-03-30 DIAGNOSIS — R0982 Postnasal drip: Secondary | ICD-10-CM | POA: Insufficient documentation

## 2023-03-30 DIAGNOSIS — H903 Sensorineural hearing loss, bilateral: Secondary | ICD-10-CM | POA: Insufficient documentation

## 2023-03-30 NOTE — Progress Notes (Signed)
Patient ID: Christina Kane, female   DOB: [birth year], 73 y.o.   MRN: [id number]  Follow-up: Eustachian tube dysfunction, hearing loss, chronic nasal congestion, nasal drainage  HPI: The patient is a 72 year old female who returns today for her follow-up evaluation.  The patient was last seen 1 year ago.  At that time, she was complaining of clogging sensation in the ears, hearing loss, chronic nasal congestion, and nasal drainage.  She was diagnosed with eustachian tube dysfunction and chronic rhinitis.  She was also noted to have bilateral symmetric high-frequency sensorineural hearing loss.  She was treated with Flonase, Atrovent, nasal saline irrigation, and Valsalva exercise.  Hearing amplification options were also discussed.  The patient returns today complaining of persistent nasal congestion and nasal drainage.  She currently wears bilateral hearing aids.  According to the patient, she had an episode of sinusitis in early October.  Her sinusitis has resolved.  Currently she denies any facial pain, fever, or visual change.  Exam: General: Communicates without difficulty, well nourished, no acute distress. Head: Normocephalic, no evidence injury, no tenderness, facial buttresses intact without stepoff. Face/sinus: No tenderness to palpation and percussion. Facial movement is normal and symmetric. Eyes: PERRL, EOMI. No scleral icterus, conjunctivae clear. Neuro: CN II exam reveals vision grossly intact.  No nystagmus at any point of gaze. Ears: Auricles well formed without lesions.  Ear canals are intact without mass or lesion.  No erythema or edema is appreciated.  The TMs are intact without fluid. Nose: External evaluation reveals normal support and skin without lesions.  Dorsum is intact.  Anterior rhinoscopy reveals congested mucosa over anterior aspect of inferior turbinates and intact septum.  No purulence noted. Oral:  Oral cavity and oropharynx are intact, symmetric, without erythema or edema.   Mucosa is moist without lesions. Neck: Full range of motion without pain.  There is no significant lymphadenopathy.  No masses palpable.  Thyroid bed within normal limits to palpation.  Parotid glands and submandibular glands equal bilaterally without mass.  Trachea is midline. Neuro:  CN 2-12 grossly intact.    Assessment: 1.  Chronic rhinitis with nasal mucosal congestion, bilateral inferior turbinate hypertrophy, and chronic postnasal drainage. 2.  Subjectively stable bilateral high-frequency sensorineural hearing loss. 3.  Her ear canals, tympanic membranes, and middle ear spaces are normal. 4.  Clinical eustachian tube dysfunction.  Plan: 1.  The physical exam findings are reviewed with the patient. 2.  Continue with Flonase/Atrovent/Valsalva exercise daily. 3.  Continue the use of her hearing aids. 4.  The patient will return for reevaluation in 1 year, sooner if needed.

## 2023-05-20 ENCOUNTER — Other Ambulatory Visit: Payer: Self-pay | Admitting: Family Medicine

## 2023-06-16 DIAGNOSIS — H16223 Keratoconjunctivitis sicca, not specified as Sjogren's, bilateral: Secondary | ICD-10-CM | POA: Diagnosis not present

## 2023-06-24 ENCOUNTER — Other Ambulatory Visit: Payer: Self-pay | Admitting: Family Medicine

## 2023-06-26 ENCOUNTER — Telehealth (INDEPENDENT_AMBULATORY_CARE_PROVIDER_SITE_OTHER): Payer: Self-pay | Admitting: Otolaryngology

## 2023-06-26 NOTE — Telephone Encounter (Signed)
Atrovent 0.06% nasal spray was called in at [facility] in [place] with 5 refills.

## 2023-07-14 ENCOUNTER — Ambulatory Visit: Payer: Medicare Other | Admitting: Family Medicine

## 2023-07-14 ENCOUNTER — Encounter: Payer: Self-pay | Admitting: Family Medicine

## 2023-07-14 VITALS — BP 132/70 | HR 64 | Temp 97.1°F | Ht 64.0 in | Wt 149.0 lb

## 2023-07-14 DIAGNOSIS — K2100 Gastro-esophageal reflux disease with esophagitis, without bleeding: Secondary | ICD-10-CM

## 2023-07-14 DIAGNOSIS — E782 Mixed hyperlipidemia: Secondary | ICD-10-CM

## 2023-07-14 DIAGNOSIS — I159 Secondary hypertension, unspecified: Secondary | ICD-10-CM

## 2023-07-14 LAB — LIPID PANEL

## 2023-07-15 LAB — CMP14+EGFR
ALT: 25 [IU]/L (ref 0–32)
AST: 30 [IU]/L (ref 0–40)
Albumin: 4.1 g/dL (ref 3.9–4.9)
Alkaline Phosphatase: 69 [IU]/L (ref 44–121)
BUN/Creatinine Ratio: 21 (ref 12–28)
BUN: 16 mg/dL (ref 8–27)
Bilirubin Total: 0.2 mg/dL (ref 0.0–1.2)
CO2: 24 mmol/L (ref 20–29)
Calcium: 9.4 mg/dL (ref 8.7–10.3)
Chloride: 100 mmol/L (ref 96–106)
Creatinine, Ser: 0.75 mg/dL (ref 0.57–1.00)
Globulin, Total: 2.7 g/dL (ref 1.5–4.5)
Glucose: 88 mg/dL (ref 70–99)
Potassium: 5 mmol/L (ref 3.5–5.2)
Sodium: 140 mmol/L (ref 134–144)
Total Protein: 6.8 g/dL (ref 6.0–8.5)
eGFR: 86 mL/min/{1.73_m2} (ref >59)

## 2023-07-15 LAB — LIPID PANEL
Cholesterol, Total: 216 mg/dL — ABNORMAL HIGH (ref 100–199)
HDL: 67 mg/dL (ref >39)
LDL CALC COMMENT:: 3.2 ratio (ref 0.0–4.4)
LDL Chol Calc (NIH): 71 mg/dL (ref 0–99)
Triglycerides: 503 mg/dL — ABNORMAL HIGH (ref 0–149)
VLDL Cholesterol Cal: 78 mg/dL — ABNORMAL HIGH (ref 5–40)

## 2023-07-15 LAB — CBC WITH DIFFERENTIAL/PLATELET
Basophils Absolute: 0.1 10*3/uL (ref 0.0–0.2)
Basos: 1 %
EOS (ABSOLUTE): 0.1 10*3/uL (ref 0.0–0.4)
Eos: 1 %
Hematocrit: 40.8 % (ref 34.0–46.6)
Hemoglobin: 12.9 g/dL (ref 11.1–15.9)
Immature Grans (Abs): 0 10*3/uL (ref 0.0–0.1)
Immature Granulocytes: 0 %
Lymphocytes Absolute: 2.5 10*3/uL (ref 0.7–3.1)
Lymphs: 28 %
MCH: 29.7 pg (ref 26.6–33.0)
MCHC: 31.6 g/dL (ref 31.5–35.7)
MCV: 94 fL (ref 79–97)
Monocytes Absolute: 0.6 10*3/uL (ref 0.1–0.9)
Monocytes: 6 %
Neutrophils Absolute: 5.8 10*3/uL (ref 1.4–7.0)
Neutrophils: 64 %
Platelets: 251 10*3/uL (ref 150–450)
RBC: 4.35 x10E6/uL (ref 3.77–5.28)
RDW: 12.7 % (ref 11.7–15.4)
WBC: 9 10*3/uL (ref 3.4–10.8)

## 2023-07-18 NOTE — Progress Notes (Signed)
Subjective:  Patient ID: Christina Kane,  female    DOB: 1950/08/16  Age: 73 y.o.    CC: No chief complaint on file.   HPI Christina Kane presents for  follow-up of hypertension. Patient has no history of headache chest pain or shortness of breath or recent cough. Patient also denies symptoms of TIA such as numbness weakness lateralizing. Patient denies side effects from medication. States taking it regularly.  Patient also  in for follow-up of elevated cholesterol. Doing well without complaints on current medication. Denies side effects  including myalgia and arthralgia and nausea. Also in today for liver function testing. Currently no chest pain, shortness of breath or other cardiovascular related symptoms noted. Patient in for follow-up of GERD. Currently asymptomatic taking  PPI daily. There is no chest pain or heartburn. No hematemesis and no melena. No dysphagia or choking. Onset is remote. Progression is stable. Complicating factors, none.    History Christina Kane has a past medical history of Hyperlipidemia and Hypertension.   She has a past surgical history that includes Anterior cruciate ligament repair (Right); Replacement total knee (Left); and Hip Arthroplasty (Left).   Her family history includes Arthritis in her mother; Cancer in her brother; Hypertension in her mother and sister; Kidney disease in her daughter; Stroke in her mother.She reports that she quit smoking about 20 years ago. Her smoking use included cigarettes. She has never used smokeless tobacco. She reports current alcohol use of about 10.0 standard drinks of alcohol per week. She reports that she does not use drugs.  Current Outpatient Medications on File Prior to Visit  Medication Sig Dispense Refill   Biotin 10 MG CAPS Take by mouth.     fluticasone (FLONASE) 50 MCG/ACT nasal spray USE 2 SPRAYS IN BOTH  NOSTRILS DAILY 64 g 0   levocetirizine (XYZAL) 5 MG tablet Take 1 tablet (5 mg total) by mouth every evening. 100  tablet 2   Magnesium 400 MG CAPS Take by mouth.     methocarbamol (ROBAXIN) 500 MG tablet Take 500 mg by mouth as needed for muscle spasms.     metoprolol tartrate (LOPRESSOR) 25 MG tablet TAKE 1 TABLET BY MOUTH DAILY 90 tablet 0   Multiple Vitamin (MULTIVITAMIN ADULT PO) Take by mouth.     omeprazole (PRILOSEC) 40 MG capsule Take 40 mg by mouth 2 (two) times a week.     rosuvastatin (CRESTOR) 10 MG tablet TAKE 1 TABLET BY MOUTH DAILY 100 tablet 1   Milk Thistle 250 MG CAPS Take by mouth.     No current facility-administered medications on file prior to visit.    ROS Review of Systems  Constitutional: Negative.   HENT: Negative.    Eyes:  Negative for visual disturbance.  Respiratory:  Negative for shortness of breath.   Cardiovascular:  Negative for chest pain.  Gastrointestinal:  Negative for abdominal pain.  Musculoskeletal:  Negative for arthralgias.    Objective:  BP 132/70   Pulse 64   Temp (!) 97.1 F (36.2 C)   Ht 5\' 4"  (1.626 m)   Wt 149 lb (67.6 kg)   SpO2 97%   BMI 25.58 kg/m   BP Readings from Last 3 Encounters:  07/14/23 132/70  01/08/23 129/64  02/12/22 (!) 138/98    Wt Readings from Last 3 Encounters:  07/14/23 149 lb (67.6 kg)  03/27/23 145 lb (65.8 kg)  01/08/23 147 lb 12.8 oz (67 kg)     Physical Exam Constitutional:  General: She is not in acute distress.    Appearance: She is well-developed.  Cardiovascular:     Rate and Rhythm: Normal rate and regular rhythm.  Pulmonary:     Breath sounds: Normal breath sounds.  Musculoskeletal:        General: Normal range of motion.  Skin:    General: Skin is warm and dry.  Neurological:     Mental Status: She is alert and oriented to person, place, and time.       Assessment & Plan:   Diagnoses and all orders for this visit:  Mixed hyperlipidemia -     CMP14+EGFR -     Lipid panel  Secondary hypertension -     CBC with Differential/Platelet -     CMP14+EGFR -     Lipid  panel  Gastroesophageal reflux disease with esophagitis without hemorrhage   I am having Christina Kane maintain her omeprazole, Biotin, Magnesium, Milk Thistle, Multiple Vitamin (MULTIVITAMIN ADULT PO), fluticasone, levocetirizine, rosuvastatin, metoprolol tartrate, and methocarbamol.  No orders of the defined types were placed in this encounter.    Follow-up: Return in about 6 months (around 01/11/2024).  Mechele Claude, M.D.

## 2023-07-23 ENCOUNTER — Other Ambulatory Visit: Payer: Self-pay | Admitting: Family Medicine

## 2023-07-23 MED ORDER — ROSUVASTATIN CALCIUM 20 MG PO TABS: 20.0000 mg | ORAL_TABLET | Freq: Every day | ORAL | 3 refills | Status: DC

## 2023-08-25 ENCOUNTER — Other Ambulatory Visit: Payer: Self-pay | Admitting: Family Medicine

## 2023-09-03 ENCOUNTER — Other Ambulatory Visit: Payer: Self-pay | Admitting: Family Medicine

## 2023-09-11 ENCOUNTER — Other Ambulatory Visit: Payer: Self-pay | Admitting: *Deleted

## 2023-09-11 MED ORDER — ROSUVASTATIN CALCIUM 20 MG PO TABS: 20.0000 mg | ORAL_TABLET | Freq: Every day | ORAL | 2 refills | Status: DC

## 2023-11-11 ENCOUNTER — Other Ambulatory Visit: Payer: Self-pay | Admitting: Family Medicine

## 2024-01-05 ENCOUNTER — Encounter: Payer: Self-pay | Admitting: Family Medicine

## 2024-01-12 ENCOUNTER — Ambulatory Visit: Payer: Medicare Other | Admitting: Family Medicine

## 2024-01-20 ENCOUNTER — Ambulatory Visit: Admitting: Family Medicine

## 2024-01-29 ENCOUNTER — Encounter: Payer: Self-pay | Admitting: Family Medicine

## 2024-01-29 ENCOUNTER — Ambulatory Visit (INDEPENDENT_AMBULATORY_CARE_PROVIDER_SITE_OTHER): Admitting: Family Medicine

## 2024-01-29 VITALS — BP 129/68 | HR 78 | Temp 98.3°F | Ht 64.0 in | Wt 147.0 lb

## 2024-01-29 DIAGNOSIS — G609 Hereditary and idiopathic neuropathy, unspecified: Secondary | ICD-10-CM

## 2024-01-29 DIAGNOSIS — R253 Fasciculation: Secondary | ICD-10-CM | POA: Diagnosis not present

## 2024-01-29 DIAGNOSIS — E782 Mixed hyperlipidemia: Secondary | ICD-10-CM | POA: Diagnosis not present

## 2024-01-29 DIAGNOSIS — G2581 Restless legs syndrome: Secondary | ICD-10-CM

## 2024-01-29 NOTE — Progress Notes (Signed)
 Subjective:  Patient ID: Christina Kane, female    DOB: [birth year]  Age: 73 y.o. MRN: [id number]  CC: Medical Management of Chronic Issues   HPI  Discussed the use of AI scribe software for clinical note transcription with the patient, who gave verbal consent to proceed.  History of Present Illness Christina Kane is a 73 year old female who presents with internal tremors and neuropathy.  She experiences internal tremors that have become increasingly bothersome, particularly at night. The sensation is described as feeling like she is 'vibrating' and disrupts her sleep, waking her up at night. Initially, she thought it might be restless leg syndrome, but the tremors are more widespread, affecting her face, hands, and chest. She uses Robaxin , a muscle relaxer, which helps alleviate the symptoms, but she prefers to use it sparingly. Magnesium, both orally and topically, is also used to help with the tremors and sleep.  She has a history of benign fasciculations diagnosed approximately ten years ago, with a neurologist able to induce the fasciculations through physical manipulation. She also has neuropathy in her feet, attributed to a combination of a severe sunburn and surgical interventions. The neuropathy is characterized by a lack of feeling under her toes and cramping, following past surgeries.  Her current medications include rosuvastatin  20 mg daily for cholesterol management and omeprazole as needed for gastrointestinal symptoms. She also takes magnesium nightly to aid with muscle relaxation and sleep.  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 She has been retired for over ten years following multiple surgeries, including knee, hip, and foot surgeries.  No anxiety. Occasional swelling in her ankles, particularly after being on her feet for extended periods.           01/29/2024   11:36 AM 01/08/2023   10:03 AM 01/08/2023    9:57 AM  Depression screen PHQ 2/9  Decreased Interest 0 0 0  Down, Depressed, Hopeless 0 0 0  PHQ - 2 Score 0 0 0  Altered sleeping 1 2   Tired, decreased energy 1 1   Change in appetite 0 0   Feeling bad or failure about yourself  0 0   Trouble concentrating 0 0   Moving slowly or fidgety/restless 0 0   Suicidal thoughts 0 0   PHQ-9 Score 2 3   Difficult doing work/chores Not difficult at all Not difficult at all     History Christina Kane has a past medical history of Arthritis (Osteo), Cataract (L Iol 05/2020 R iol 06/2020), GERD (gastroesophageal reflux disease), Heart murmur, Hyperlipidemia, and Hypertension.   She has a past surgical history that includes Anterior cruciate ligament repair (Right); Replacement total knee (Left); Hip Arthroplasty (Left); Tubal ligation (1985); Cosmetic surgery (Dermabrasion); and Joint replacement (L knee 07/2010 L hip 09/2021).   Her family history includes Arthritis in her mother; Cancer in her brother; Hypertension in her mother and sister; Kidney disease in her daughter; Stroke in her mother.She reports that she quit smoking about 20 years ago. Her smoking use included cigarettes. She has never used smokeless tobacco. She reports current alcohol use of about 10.0 standard drinks of alcohol per week. She reports that she does not use drugs.    ROS Review of Systems  Constitutional: Negative.   HENT: Negative.    Eyes:  Negative for visual disturbance.  Respiratory:  Negative for shortness of breath.   Cardiovascular:  Negative for chest pain.  Gastrointestinal:  Negative for abdominal pain.  Musculoskeletal:  Negative for arthralgias.    Objective:  BP 129/68   Pulse 78   Temp 98.3 F (36.8 C)   Ht 5' 4 (1.626 m)   Wt 147 lb (66.7 kg)   SpO2 98%   BMI 25.23 kg/m   BP Readings from Last 3 Encounters:  01/29/24 129/68  07/14/23 132/70  01/08/23 129/64    Wt Readings from Last 3  Encounters:  01/29/24 147 lb (66.7 kg)  07/14/23 149 lb (67.6 kg)  03/27/23 145 lb (65.8 kg)     Physical Exam Constitutional:      General: She is not in acute distress.    Appearance: She is well-developed.  Cardiovascular:     Rate and Rhythm: Normal rate and regular rhythm.  Pulmonary:     Breath sounds: Normal breath sounds.  Musculoskeletal:        General: Normal range of motion.  Skin:    General: Skin is warm and dry.  Neurological:     Mental Status: She is alert and oriented to person, place, and time.      Assessment & Plan:  Benign fasciculation-cramp syndrome -     CMP14+EGFR -     CBC with Differential/Platelet -     Lipid panel -     Ambulatory referral to Neurology  RLS (restless legs syndrome) -     CMP14+EGFR -     CBC with Differential/Platelet -     Lipid panel  Mixed hyperlipidemia -     CMP14+EGFR -     CBC with Differential/Platelet -     Lipid panel  Idiopathic peripheral neuropathy -     Ambulatory referral to Neurology    Assessment and Plan Assessment & Plan Tremor and benign fasciculations   She experiences worsening internal tremors affecting sleep, described as a vibrating sensation, not related to anxiety. Methocarbamol  provides relief, but tremors persist beyond magnesium supplementation. Anxiety is considered in the differential, but she denies it. Refer to a neurologist specializing in tremors and peripheral neuropathies. Continue Methocarbamol  500 MG as needed for muscle spasms. Order magnesium level to ensure no excess intake.  Peripheral neuropathy of feet, postsurgical and traumatic   She has peripheral neuropathy in her feet due to surgical and traumatic causes, including a severe burn and surgery. Reports cramping and lack of feeling in toes, consistent with neuropathic pain. Previous nerve biopsy confirmed nerve damage. Refer to a neurologist specializing in peripheral neuropathies for further evaluation and  management.  Mixed hyperlipidemia   She is on Rosuvastatin  20 MG daily for cholesterol management with good tolerance. A recent dosage increase is noted. She is due for a cholesterol level check. Order cholesterol level and liver function tests. Continue Rosuvastatin  20 MG daily.  Gastroesophageal reflux disease with esophagitis   She takes Omeprazole as needed, purchasing it over the counter. Prefers a prescription for cost-effectiveness, although uses it less frequently. Prescribe Omeprazole 40 MG daily.  Recording duration: 19 minutes       Follow-up: Return in about 6 months (around 07/31/2024), or if symptoms worsen or fail to improve.  Butler Der, M.D.

## 2024-01-30 LAB — CMP14+EGFR
ALT: 44 IU/L — ABNORMAL HIGH (ref 0–32)
AST: 32 IU/L (ref 0–40)
Albumin: 4.5 g/dL (ref 3.9–4.9)
Alkaline Phosphatase: 69 IU/L (ref 44–121)
BUN/Creatinine Ratio: 23 (ref 12–28)
BUN: 17 mg/dL (ref 8–27)
Bilirubin Total: 0.3 mg/dL (ref 0.0–1.2)
CO2: 23 mmol/L (ref 20–29)
Calcium: 9.7 mg/dL (ref 8.7–10.3)
Chloride: 100 mmol/L (ref 96–106)
Creatinine, Ser: 0.75 mg/dL (ref 0.57–1.00)
Globulin, Total: 2.5 g/dL (ref 1.5–4.5)
Glucose: 93 mg/dL (ref 70–99)
Potassium: 4.7 mmol/L (ref 3.5–5.2)
Sodium: 139 mmol/L (ref 134–144)
Total Protein: 7 g/dL (ref 6.0–8.5)
eGFR: 86 mL/min/1.73 (ref >59)

## 2024-01-30 LAB — CBC WITH DIFFERENTIAL/PLATELET
Basophils Absolute: 0 x10E3/uL (ref 0.0–0.2)
Basos: 0 %
EOS (ABSOLUTE): 0 x10E3/uL (ref 0.0–0.4)
Eos: 0 %
Hematocrit: 41.4 % (ref 34.0–46.6)
Hemoglobin: 13.6 g/dL (ref 11.1–15.9)
Immature Grans (Abs): 0 x10E3/uL (ref 0.0–0.1)
Immature Granulocytes: 0 %
Lymphocytes Absolute: 2.6 x10E3/uL (ref 0.7–3.1)
Lymphs: 22 %
MCH: 31.2 pg (ref 26.6–33.0)
MCHC: 32.9 g/dL (ref 31.5–35.7)
MCV: 95 fL (ref 79–97)
Monocytes Absolute: 0.9 x10E3/uL (ref 0.1–0.9)
Monocytes: 8 %
Neutrophils Absolute: 8 x10E3/uL — ABNORMAL HIGH (ref 1.4–7.0)
Neutrophils: 70 %
Platelets: 265 x10E3/uL (ref 150–450)
RBC: 4.36 x10E6/uL (ref 3.77–5.28)
RDW: 12.7 % (ref 11.7–15.4)
WBC: 11.5 x10E3/uL — ABNORMAL HIGH (ref 3.4–10.8)

## 2024-01-30 LAB — LIPID PANEL
Chol/HDL Ratio: 2.5 ratio (ref 0.0–4.4)
Cholesterol, Total: 212 mg/dL — ABNORMAL HIGH (ref 100–199)
HDL: 84 mg/dL (ref >39)
LDL Chol Calc (NIH): 70 mg/dL (ref 0–99)
Triglycerides: 378 mg/dL — ABNORMAL HIGH (ref 0–149)
VLDL Cholesterol Cal: 58 mg/dL — ABNORMAL HIGH (ref 5–40)

## 2024-02-09 ENCOUNTER — Ambulatory Visit: Payer: Self-pay | Admitting: Family Medicine

## 2024-02-09 NOTE — Progress Notes (Signed)
Hello Christina Kane,  Your lab result is normal and/or stable.Some minor variations that are not significant are commonly marked abnormal, but do not represent any medical problem for you.  Best regards, Meilin Brosh, M.D.

## 2024-03-01 ENCOUNTER — Other Ambulatory Visit: Payer: Self-pay | Admitting: Family Medicine

## 2024-03-24 ENCOUNTER — Encounter: Payer: Self-pay | Admitting: Family Medicine

## 2024-03-24 NOTE — Telephone Encounter (Signed)
UPDATED IMMUNIZATION RECORD

## 2024-05-09 ENCOUNTER — Other Ambulatory Visit: Payer: Self-pay | Admitting: Family Medicine

## 2024-05-25 ENCOUNTER — Other Ambulatory Visit: Payer: Self-pay | Admitting: Family Medicine

## 2024-05-25 DIAGNOSIS — Z1231 Encounter for screening mammogram for malignant neoplasm of breast: Secondary | ICD-10-CM

## 2024-06-07 ENCOUNTER — Ambulatory Visit
Admission: RE | Admit: 2024-06-07 | Discharge: 2024-06-07 | Disposition: A | Discharge status: Home / Self Care | Source: Ambulatory Visit | Attending: Family Medicine | Admitting: Family Medicine

## 2024-06-07 DIAGNOSIS — Z1231 Encounter for screening mammogram for malignant neoplasm of breast: Secondary | ICD-10-CM

## 2024-07-19 ENCOUNTER — Ambulatory Visit: Admitting: Diagnostic Neuroimaging

## 2024-07-29 ENCOUNTER — Ambulatory Visit: Payer: Self-pay | Admitting: Family Medicine
